# Patient Record
Sex: Female | Born: 2000 | Race: Black or African American | Hispanic: No | Marital: Single | Smoking: Current some day smoker
Health system: Southern US, Community
[De-identification: ages and names within clinical notes are randomized; demographics above are authoritative.]

## PROBLEM LIST (undated history)

## (undated) ENCOUNTER — Emergency Department (HOSPITAL_COMMUNITY): Admission: EM | Payer: Medicaid Other | Source: Home / Self Care

## (undated) DIAGNOSIS — Z8619 Personal history of other infectious and parasitic diseases: Secondary | ICD-10-CM

## (undated) DIAGNOSIS — Z87448 Personal history of other diseases of urinary system: Secondary | ICD-10-CM

## (undated) DIAGNOSIS — Z9889 Other specified postprocedural states: Secondary | ICD-10-CM

## (undated) DIAGNOSIS — R112 Nausea with vomiting, unspecified: Secondary | ICD-10-CM

## (undated) DIAGNOSIS — Z8669 Personal history of other diseases of the nervous system and sense organs: Secondary | ICD-10-CM

## (undated) HISTORY — DX: Personal history of other infectious and parasitic diseases: Z86.19

## (undated) HISTORY — DX: Personal history of other diseases of the nervous system and sense organs: Z86.69

## (undated) HISTORY — DX: Personal history of other diseases of urinary system: Z87.448

---
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
## 2020-01-28 ENCOUNTER — Other Ambulatory Visit: Payer: Self-pay

## 2020-01-28 ENCOUNTER — Encounter: Payer: Self-pay | Admitting: Family Medicine

## 2020-01-28 ENCOUNTER — Ambulatory Visit (INDEPENDENT_AMBULATORY_CARE_PROVIDER_SITE_OTHER): Payer: Medicaid Other | Admitting: Family Medicine

## 2020-01-28 VITALS — BP 90/65 | HR 68 | Ht 62.09 in | Wt 139.4 lb

## 2020-01-28 DIAGNOSIS — N62 Hypertrophy of breast: Secondary | ICD-10-CM | POA: Diagnosis not present

## 2020-01-28 NOTE — Progress Notes (Signed)
   SUBJECTIVE:   CHIEF COMPLAINT / HPI:   Breast concern - large breasts - fitted for bra 3 months ago at [facility] - size E, 34-36 EE - noting pain with and without well fitting bra - hard to sleep at night - lifts boxes at work and feels this impedes - feels like has developed hunch - some back pain, comes and goes.  - no skin changes, nipple discharge, dimpling, lumps or bumps - wants referral for breast reduction  PERTINENT  PMH / PSH: Asthma  OBJECTIVE:   BP 90/65   Pulse 68   Ht 5' 2.09" (1.577 m)   Wt 139 lb 6.4 oz (63.2 kg)   LMP 01/28/2020 (Exact Date)   SpO2 100%   BMI 25.43 kg/m   Gen: well appearing, in NAD Breast: large breasts, symmetric.   ASSESSMENT/PLAN:   Large breasts Impeding daily life and with associated chest and back pain despite well fitting bra. Referral placed for breast reduction surgery per patient request.     Ellwood Dense, DO [facility]

## 2020-01-28 NOTE — Patient Instructions (Signed)
It was great to see you!  Our plans for today:  - We referred you to plastic surgery for consultation for breast reduction. Let us know if you don't hear about an appointment in a few weeks.  - Continue to wear well supporting bras.  Try ibuprofen for pain and heating pad for back pain. - Follow-up soon for depression.  Take care and seek immediate care sooner if you develop any concerns.   Dr. [facility]

## 2020-01-28 NOTE — Progress Notes (Signed)
Patient given PHQ2 and PHQ9.  Provider aware.  .Kyro Joswick R Karcyn Menn, CMA  

## 2020-01-29 DIAGNOSIS — N62 Hypertrophy of breast: Secondary | ICD-10-CM | POA: Insufficient documentation

## 2020-01-29 NOTE — Assessment & Plan Note (Addendum)
Impeding daily life and with associated chest and back pain despite well fitting bra. Referral placed for breast reduction surgery per patient request.

## 2020-02-17 DIAGNOSIS — M549 Dorsalgia, unspecified: Secondary | ICD-10-CM | POA: Diagnosis not present

## 2020-02-17 DIAGNOSIS — N62 Hypertrophy of breast: Secondary | ICD-10-CM | POA: Diagnosis not present

## 2020-02-17 DIAGNOSIS — G8929 Other chronic pain: Secondary | ICD-10-CM | POA: Diagnosis not present

## 2020-02-17 DIAGNOSIS — M542 Cervicalgia: Secondary | ICD-10-CM | POA: Diagnosis not present

## 2020-03-01 ENCOUNTER — Ambulatory Visit (INDEPENDENT_AMBULATORY_CARE_PROVIDER_SITE_OTHER): Payer: Medicaid Other | Admitting: Family Medicine

## 2020-03-01 ENCOUNTER — Encounter: Payer: Self-pay | Admitting: Family Medicine

## 2020-03-01 ENCOUNTER — Other Ambulatory Visit: Payer: Self-pay

## 2020-03-01 VITALS — BP 100/70 | HR 76 | Ht 62.5 in | Wt 133.6 lb

## 2020-03-01 DIAGNOSIS — K5904 Chronic idiopathic constipation: Secondary | ICD-10-CM

## 2020-03-01 DIAGNOSIS — R112 Nausea with vomiting, unspecified: Secondary | ICD-10-CM | POA: Diagnosis not present

## 2020-03-01 DIAGNOSIS — F1210 Cannabis abuse, uncomplicated: Secondary | ICD-10-CM | POA: Diagnosis not present

## 2020-03-01 LAB — POCT URINE PREGNANCY: Preg Test, Ur: NEGATIVE

## 2020-03-01 MED ORDER — ONDANSETRON 4 MG PO TBDP: 4.0000 mg | ORAL_TABLET | Freq: Three times a day (TID) | ORAL | 0 refills | Status: DC | PRN

## 2020-03-01 MED ORDER — POLYETHYLENE GLYCOL 3350 17 G PO PACK: 17.0000 g | PACK | Freq: Two times a day (BID) | ORAL | 3 refills | Status: AC

## 2020-03-01 NOTE — Patient Instructions (Signed)
It was wonderful to see you today.  Please bring ALL of your medications with you to every visit.   Today we talked about:  - Getting blood work--let me know if your symptoms do not improve  - Using Miralax twice per day  - go to the pharmacy to obtain Zofran   Thank you for choosing [facility].   Please call [phone] with any questions about today's appointment.  Please be sure to schedule follow up at the front  desk before you leave today.   [name], MD  Family Medicine

## 2020-03-01 NOTE — Progress Notes (Signed)
    SUBJECTIVE:   CHIEF COMPLAINT / HPI:   Caitlyn Campos is a pleasant 19 year old girl with history significant for macromastia presenting today for nausea and vomiting for the past 5 days.  The patient reports that over the past 5 days she has woken up with a lot of nausea.  Each morning she has had 1 episode of nonbloody nonbilious emesis.  She denies hematemesis, melena, hematochezia or abdominal pain at all.  She reports she is not sexually active.  Her last period was last week.  She does not drink alcohol on a regular basis.  She smokes between 4-6 blunts per day.  She has had a history of cannabis induced vomiting reports when she stopped this previously it improved.  She is scheduled to undergo a breast reduction surgery later this month and reports she has to quit cannabis anyway.  She endorses constipation.  She does not regularly use MiraLAX.  The patient's PHQ was elevated today.  We discussed this.  She reports that overall her mood is better.  She feels like she is doing well but reports that her ADHD and depression sometimes affect her and she turns to cannabis for relief.  She declines therapy resources or medication today.  She reports she has considerable support through her family.  The patient's mom is present for portion of this visit and agrees with above.  PERTINENT  PMH / PSH/Family/Social History : Reviewed and updated.  OBJECTIVE:   BP 100/70   Pulse 76   Ht 5' 2.5" (1.588 m)   Wt 133 lb 9.6 oz (60.6 kg)   LMP 02/23/2020 (Approximate)   SpO2 98%   BMI 24.05 kg/m   HEENT: Sclera anicteric. Dentition is moderate. Appears well hydrated. Neck: Supple Cardiac: Regular rate and rhythm. Normal S1/S2. No murmurs, rubs, or gallops appreciated. Lungs: Clear bilaterally to ascultation.  Abdomen: Normoactive bowel sounds. No tenderness to deep or light palpation. No rebound or guarding. No RUQ or epigastric pain.  Extremities: Warm, well perfused without edema.  Skin:  Warm, dry Psych: Pleasant and appropriate     ASSESSMENT/PLAN:   Acute Nausea and Vomiting, considered pregnancy as cause however pregnancy test is negative and she had a period last week.  I suspect this is due to cannabis.  Also considered viral enteritis however no else in the family is sick.  She denies alcohol use.  We discussed at length the benefits of quitting cannabis and the negative effects on ADHD.  Prescription for ondansetron was given.  EKG from 2018 was reviewed.  We discussed appropriate dosing.  Also discussed side effect of constipation and prescribed MiraLAX. Reviewed reasons to call and return to care.   [name], MD  Family Medicine Teaching Service  [facility]

## 2020-03-17 DIAGNOSIS — N62 Hypertrophy of breast: Secondary | ICD-10-CM | POA: Diagnosis not present

## 2020-03-17 DIAGNOSIS — M542 Cervicalgia: Secondary | ICD-10-CM | POA: Diagnosis not present

## 2020-03-17 DIAGNOSIS — G8929 Other chronic pain: Secondary | ICD-10-CM | POA: Diagnosis not present

## 2020-03-17 DIAGNOSIS — M549 Dorsalgia, unspecified: Secondary | ICD-10-CM | POA: Diagnosis not present

## 2020-03-17 NOTE — H&P (Signed)
Subjective:     Patient ID: Caitlyn Campos is a 19 y.o. female.  HPI  Here for follow up discussion prior to planned breast reduction. Current GG. Notes thelarche age 18 years of age, menarche approximately age 7 . Notes no change bra size over last year. Reports several year history neck shoulder and back pain. This has not improved with OTC pain medication, hot/cold packs, regular activity/stretching. Notes rash beneath breasts worse in warm weather that occurs at least twice a week despite topical medication and hygiene measures. Works at [employer]. Reports difficulty lifting, carrying, bending due to breast size  Wt down 20 lb over last year, states happy at current weight. When asked about having children someday reports in the "far future."  No prior MMG. Denies family history breast or ovarian ca.   PMH significant for asthma. Lives with mother and sisters.  Review of Systems    Objective:   Physical Exam Cardiovascular:     Rate and Rhythm: Normal rate and regular rhythm.     Heart sounds: Normal heart sounds.  Pulmonary:     Effort: Pulmonary effort is normal.     Breath sounds: Normal breath sounds.  Lymphadenopathy:     Upper Body:     Right upper body: No axillary adenopathy.     Left upper body: No axillary adenopathy.  Skin:    Comments: Fitzpatrick 5    Grade 3 ptosis bilateral No masses Striae over bilateral breasts +shoulder grooving SN to nipple R 34 L 35 cm BW R 21 L 21 cm  Nipple to IMF R 16 L 16 cm     Assessment:     Macromastia Chronic neck and back pain    Plan:     Chronic neck and back pain, intertrigo in setting macromastia that has failed conservative measures. Breast reduction has expectation to relieve symptoms.   Reviewed reduction with anchor type scars, OP surgery, drains, post operative visits and limitations, recovery. Diminished sensation nipple and breast skin, risk of nipple loss, wound healing problems, asymmetry, incidental  carcinoma, changes with wt gain/loss, aging, unacceptable cosmetic appearance reviewed. Discussed changes with pregnancy, effect on breast feeding. Counseled cannot assure cup size.  Anticipate 351 g reduction from each breast. Discussed this as estimate required for insurance with mother as she has concerns needs more volume resected to be worthwhile for patient.  Additional risks including but not limited to bleeding, seroma, hematoma, damage to adjacent structures, blood clots in legs or lungs, infection, need for additional procedures reviewed.  Discussed risk COVID infectionthrough this elective surgery. Patient will receive COVID testing prior to surgery. Discussed even if patient receivesa negative test result, the tests in some cases may fail to detect the virus or patient maycontract COVID after the test.COVID 19 infectionbefore/during/aftersurgery may result in lead to a higher chance of complication and death.  Rx for tramadol given. Mother reported tramadol not effective for herself- counseled if not effective for patient will try another medication. Reviewed use of Motrin and Tylenol as well post op. Drain teaching completed.

## 2020-03-22 ENCOUNTER — Other Ambulatory Visit: Payer: Self-pay

## 2020-03-22 ENCOUNTER — Encounter (HOSPITAL_BASED_OUTPATIENT_CLINIC_OR_DEPARTMENT_OTHER): Payer: Self-pay | Admitting: Plastic Surgery

## 2020-03-26 ENCOUNTER — Other Ambulatory Visit (HOSPITAL_COMMUNITY)
Admission: RE | Admit: 2020-03-26 | Discharge: 2020-03-26 | Disposition: A | Discharge status: Home / Self Care | Payer: Medicaid Other | Source: Ambulatory Visit | Attending: Plastic Surgery | Admitting: Plastic Surgery

## 2020-03-26 DIAGNOSIS — Z01812 Encounter for preprocedural laboratory examination: Secondary | ICD-10-CM | POA: Insufficient documentation

## 2020-03-26 DIAGNOSIS — Z20822 Contact with and (suspected) exposure to covid-19: Secondary | ICD-10-CM | POA: Diagnosis not present

## 2020-03-26 LAB — SARS CORONAVIRUS 2 (TAT 6-24 HRS): SARS Coronavirus 2: NEGATIVE

## 2020-03-30 ENCOUNTER — Encounter (HOSPITAL_BASED_OUTPATIENT_CLINIC_OR_DEPARTMENT_OTHER): Payer: Self-pay | Admitting: Plastic Surgery

## 2020-03-30 ENCOUNTER — Ambulatory Visit (HOSPITAL_BASED_OUTPATIENT_CLINIC_OR_DEPARTMENT_OTHER)
Admission: RE | Admit: 2020-03-30 | Discharge: 2020-03-30 | Disposition: A | Discharge status: Home / Self Care | Payer: Medicaid Other | Attending: Plastic Surgery | Admitting: Plastic Surgery

## 2020-03-30 ENCOUNTER — Ambulatory Visit (HOSPITAL_BASED_OUTPATIENT_CLINIC_OR_DEPARTMENT_OTHER): Payer: Medicaid Other | Admitting: Certified Registered"

## 2020-03-30 ENCOUNTER — Encounter (HOSPITAL_BASED_OUTPATIENT_CLINIC_OR_DEPARTMENT_OTHER)
Admission: RE | Disposition: A | Discharge status: Home / Self Care | Payer: Self-pay | Source: Home / Self Care | Attending: Plastic Surgery

## 2020-03-30 ENCOUNTER — Other Ambulatory Visit: Payer: Self-pay

## 2020-03-30 DIAGNOSIS — M549 Dorsalgia, unspecified: Secondary | ICD-10-CM | POA: Diagnosis not present

## 2020-03-30 DIAGNOSIS — N62 Hypertrophy of breast: Secondary | ICD-10-CM | POA: Diagnosis not present

## 2020-03-30 DIAGNOSIS — M542 Cervicalgia: Secondary | ICD-10-CM | POA: Insufficient documentation

## 2020-03-30 DIAGNOSIS — G8929 Other chronic pain: Secondary | ICD-10-CM | POA: Insufficient documentation

## 2020-03-30 HISTORY — PX: BREAST REDUCTION SURGERY: SHX8

## 2020-03-30 HISTORY — DX: Nausea with vomiting, unspecified: Z98.890

## 2020-03-30 HISTORY — DX: Other specified postprocedural states: R11.2

## 2020-03-30 LAB — POCT PREGNANCY, URINE
Preg Test, Ur: NEGATIVE
Preg Test, Ur: NEGATIVE

## 2020-03-30 SURGERY — MAMMOPLASTY, REDUCTION
Anesthesia: General | Site: Breast | Laterality: Bilateral

## 2020-03-30 MED ORDER — PROPOFOL 500 MG/50ML IV EMUL
INTRAVENOUS | Status: AC
  Filled 2020-03-30: qty 50

## 2020-03-30 MED ORDER — ONDANSETRON HCL 4 MG/2ML IJ SOLN
INTRAMUSCULAR | Status: DC | PRN
  Administered 2020-03-30 (×2): 4 mg via INTRAVENOUS

## 2020-03-30 MED ORDER — PHENYLEPHRINE 40 MCG/ML (10ML) SYRINGE FOR IV PUSH (FOR BLOOD PRESSURE SUPPORT)
PREFILLED_SYRINGE | INTRAVENOUS | Status: AC
  Filled 2020-03-30: qty 10

## 2020-03-30 MED ORDER — BUPIVACAINE HCL (PF) 0.5 % IJ SOLN
INTRAMUSCULAR | Status: AC
  Filled 2020-03-30: qty 30

## 2020-03-30 MED ORDER — DEXMEDETOMIDINE (PRECEDEX) IN NS 20 MCG/5ML (4 MCG/ML) IV SYRINGE
PREFILLED_SYRINGE | INTRAVENOUS | Status: AC
  Filled 2020-03-30: qty 5

## 2020-03-30 MED ORDER — PROMETHAZINE HCL 25 MG/ML IJ SOLN: 6.2500 mg | INTRAMUSCULAR | Status: DC | PRN

## 2020-03-30 MED ORDER — LIDOCAINE 2% (20 MG/ML) 5 ML SYRINGE
INTRAMUSCULAR | Status: AC
  Filled 2020-03-30: qty 5

## 2020-03-30 MED ORDER — MIDAZOLAM HCL 5 MG/5ML IJ SOLN
INTRAMUSCULAR | Status: DC | PRN
  Administered 2020-03-30: 2 mg via INTRAVENOUS

## 2020-03-30 MED ORDER — DEXAMETHASONE SODIUM PHOSPHATE 10 MG/ML IJ SOLN
INTRAMUSCULAR | Status: AC
  Filled 2020-03-30: qty 2

## 2020-03-30 MED ORDER — OXYCODONE HCL 5 MG PO TABS: 5.0000 mg | ORAL_TABLET | Freq: Once | ORAL | Status: DC | PRN

## 2020-03-30 MED ORDER — ACETAMINOPHEN 500 MG PO TABS: 1000.0000 mg | ORAL_TABLET | ORAL | Status: DC

## 2020-03-30 MED ORDER — SUCCINYLCHOLINE CHLORIDE 200 MG/10ML IV SOSY
PREFILLED_SYRINGE | INTRAVENOUS | Status: AC
  Filled 2020-03-30: qty 10

## 2020-03-30 MED ORDER — ONDANSETRON HCL 4 MG/2ML IJ SOLN
INTRAMUSCULAR | Status: AC
  Filled 2020-03-30: qty 4

## 2020-03-30 MED ORDER — AMISULPRIDE (ANTIEMETIC) 5 MG/2ML IV SOLN
10.0000 mg | Freq: Once | INTRAVENOUS | Status: AC | PRN
  Administered 2020-03-30: 10 mg via INTRAVENOUS

## 2020-03-30 MED ORDER — SUFENTANIL CITRATE 50 MCG/ML IV SOLN
INTRAVENOUS | Status: AC
  Filled 2020-03-30: qty 1

## 2020-03-30 MED ORDER — SUCCINYLCHOLINE CHLORIDE 20 MG/ML IJ SOLN
INTRAMUSCULAR | Status: DC | PRN
  Administered 2020-03-30: 120 mg via INTRAVENOUS

## 2020-03-30 MED ORDER — OXYCODONE HCL 5 MG/5ML PO SOLN: 5.0000 mg | Freq: Once | ORAL | Status: DC | PRN

## 2020-03-30 MED ORDER — DIPHENHYDRAMINE HCL 50 MG/ML IJ SOLN
INTRAMUSCULAR | Status: DC | PRN
  Administered 2020-03-30: 6.25 mg via INTRAVENOUS

## 2020-03-30 MED ORDER — ACETAMINOPHEN 10 MG/ML IV SOLN
INTRAVENOUS | Status: AC
  Filled 2020-03-30: qty 100

## 2020-03-30 MED ORDER — EPHEDRINE 5 MG/ML INJ
INTRAVENOUS | Status: AC
  Filled 2020-03-30: qty 10

## 2020-03-30 MED ORDER — SUFENTANIL CITRATE 50 MCG/ML IV SOLN
INTRAVENOUS | Status: DC | PRN
  Administered 2020-03-30: 5 ug via INTRAVENOUS
  Administered 2020-03-30: 15 ug via INTRAVENOUS
  Administered 2020-03-30: 10 ug via INTRAVENOUS
  Administered 2020-03-30: 5 ug via INTRAVENOUS

## 2020-03-30 MED ORDER — MEPERIDINE HCL 25 MG/ML IJ SOLN: 6.2500 mg | INTRAMUSCULAR | Status: DC | PRN

## 2020-03-30 MED ORDER — ACETAMINOPHEN 500 MG PO TABS
ORAL_TABLET | ORAL | Status: AC
  Filled 2020-03-30: qty 2

## 2020-03-30 MED ORDER — AMISULPRIDE (ANTIEMETIC) 5 MG/2ML IV SOLN
INTRAVENOUS | Status: AC
  Filled 2020-03-30: qty 4

## 2020-03-30 MED ORDER — DEXAMETHASONE SODIUM PHOSPHATE 4 MG/ML IJ SOLN
INTRAMUSCULAR | Status: DC | PRN
  Administered 2020-03-30: 10 mg via INTRAVENOUS

## 2020-03-30 MED ORDER — ACETAMINOPHEN 10 MG/ML IV SOLN
INTRAVENOUS | Status: DC | PRN
  Administered 2020-03-30: 1000 mg via INTRAVENOUS

## 2020-03-30 MED ORDER — CELECOXIB 200 MG PO CAPS
ORAL_CAPSULE | ORAL | Status: AC
  Filled 2020-03-30: qty 1

## 2020-03-30 MED ORDER — BUPIVACAINE HCL (PF) 0.5 % IJ SOLN
INTRAMUSCULAR | Status: DC | PRN
  Administered 2020-03-30: 30 mL

## 2020-03-30 MED ORDER — HYDROMORPHONE HCL 1 MG/ML IJ SOLN: 0.2500 mg | INTRAMUSCULAR | Status: DC | PRN

## 2020-03-30 MED ORDER — CHLORHEXIDINE GLUCONATE CLOTH 2 % EX PADS: 6.0000 | MEDICATED_PAD | Freq: Once | CUTANEOUS | Status: DC

## 2020-03-30 MED ORDER — OXYCODONE HCL 5 MG PO TABS
ORAL_TABLET | ORAL | Status: AC
  Filled 2020-03-30: qty 1

## 2020-03-30 MED ORDER — LIDOCAINE HCL (CARDIAC) PF 100 MG/5ML IV SOSY
PREFILLED_SYRINGE | INTRAVENOUS | Status: DC | PRN
  Administered 2020-03-30: 60 mg via INTRAVENOUS

## 2020-03-30 MED ORDER — CELECOXIB 200 MG PO CAPS: 200.0000 mg | ORAL_CAPSULE | ORAL | Status: DC

## 2020-03-30 MED ORDER — SUGAMMADEX SODIUM 200 MG/2ML IV SOLN
INTRAVENOUS | Status: DC | PRN
  Administered 2020-03-30: 150 mg via INTRAVENOUS

## 2020-03-30 MED ORDER — DEXAMETHASONE SODIUM PHOSPHATE 10 MG/ML IJ SOLN
INTRAMUSCULAR | Status: AC
  Filled 2020-03-30: qty 1

## 2020-03-30 MED ORDER — 0.9 % SODIUM CHLORIDE (POUR BTL) OPTIME
TOPICAL | Status: DC | PRN
  Administered 2020-03-30: 500 mL

## 2020-03-30 MED ORDER — GABAPENTIN 300 MG PO CAPS
ORAL_CAPSULE | ORAL | Status: AC
  Filled 2020-03-30: qty 1

## 2020-03-30 MED ORDER — CEFAZOLIN SODIUM-DEXTROSE 2-4 GM/100ML-% IV SOLN
INTRAVENOUS | Status: AC
  Filled 2020-03-30: qty 100

## 2020-03-30 MED ORDER — LACTATED RINGERS IV SOLN: INTRAVENOUS | Status: DC

## 2020-03-30 MED ORDER — PROPOFOL 500 MG/50ML IV EMUL
INTRAVENOUS | Status: AC
  Filled 2020-03-30: qty 100

## 2020-03-30 MED ORDER — GABAPENTIN 300 MG PO CAPS: 300.0000 mg | ORAL_CAPSULE | ORAL | Status: DC

## 2020-03-30 MED ORDER — CEFAZOLIN SODIUM-DEXTROSE 2-4 GM/100ML-% IV SOLN
2.0000 g | INTRAVENOUS | Status: AC
  Administered 2020-03-30: 2 g via INTRAVENOUS

## 2020-03-30 MED ORDER — PROPOFOL 500 MG/50ML IV EMUL
INTRAVENOUS | Status: DC | PRN
  Administered 2020-03-30: 25 ug/kg/min via INTRAVENOUS

## 2020-03-30 MED ORDER — ROCURONIUM BROMIDE 100 MG/10ML IV SOLN
INTRAVENOUS | Status: DC | PRN
  Administered 2020-03-30: 60 mg via INTRAVENOUS

## 2020-03-30 MED ORDER — PROPOFOL 10 MG/ML IV BOLUS
INTRAVENOUS | Status: DC | PRN
  Administered 2020-03-30: 150 mg via INTRAVENOUS

## 2020-03-30 MED ORDER — ONDANSETRON HCL 4 MG/2ML IJ SOLN
INTRAMUSCULAR | Status: AC
  Filled 2020-03-30: qty 2

## 2020-03-30 MED ORDER — ROCURONIUM BROMIDE 10 MG/ML (PF) SYRINGE
PREFILLED_SYRINGE | INTRAVENOUS | Status: AC
  Filled 2020-03-30: qty 10

## 2020-03-30 MED ORDER — MIDAZOLAM HCL 2 MG/2ML IJ SOLN
INTRAMUSCULAR | Status: AC
  Filled 2020-03-30: qty 2

## 2020-03-30 MED ORDER — DIPHENHYDRAMINE HCL 50 MG/ML IJ SOLN
INTRAMUSCULAR | Status: AC
  Filled 2020-03-30: qty 1

## 2020-03-30 SURGICAL SUPPLY — 57 items
APPLIER CLIP 9.375 MED OPEN (MISCELLANEOUS)
BINDER BREAST 3XL (GAUZE/BANDAGES/DRESSINGS) IMPLANT
BINDER BREAST LRG (GAUZE/BANDAGES/DRESSINGS) ×2 IMPLANT
BINDER BREAST MEDIUM (GAUZE/BANDAGES/DRESSINGS) IMPLANT
BINDER BREAST XLRG (GAUZE/BANDAGES/DRESSINGS) IMPLANT
BINDER BREAST XXLRG (GAUZE/BANDAGES/DRESSINGS) IMPLANT
BLADE SURG 10 STRL SS (BLADE) ×8 IMPLANT
BNDG GAUZE ELAST 4 BULKY (GAUZE/BANDAGES/DRESSINGS) ×4 IMPLANT
CANISTER SUCT 1200ML W/VALVE (MISCELLANEOUS) ×2 IMPLANT
CHLORAPREP W/TINT 26 (MISCELLANEOUS) ×6 IMPLANT
CLIP APPLIE 9.375 MED OPEN (MISCELLANEOUS) IMPLANT
CLIP VESOCCLUDE MED 6/CT (CLIP) IMPLANT
COVER BACK TABLE 60X90IN (DRAPES) ×2 IMPLANT
COVER MAYO STAND STRL (DRAPES) ×2 IMPLANT
COVER WAND RF STERILE (DRAPES) IMPLANT
DERMABOND ADVANCED (GAUZE/BANDAGES/DRESSINGS) ×2
DERMABOND ADVANCED .7 DNX12 (GAUZE/BANDAGES/DRESSINGS) ×2 IMPLANT
DRAIN CHANNEL 15F RND FF W/TCR (WOUND CARE) ×4 IMPLANT
DRAPE TOP ARMCOVERS (MISCELLANEOUS) ×2 IMPLANT
DRAPE U-SHAPE 76X120 STRL (DRAPES) ×2 IMPLANT
DRAPE UTILITY XL STRL (DRAPES) ×2 IMPLANT
DRSG PAD ABDOMINAL 8X10 ST (GAUZE/BANDAGES/DRESSINGS) ×4 IMPLANT
ELECT COATED BLADE 2.86 ST (ELECTRODE) ×2 IMPLANT
ELECT REM PT RETURN 9FT ADLT (ELECTROSURGICAL) ×2
ELECTRODE REM PT RTRN 9FT ADLT (ELECTROSURGICAL) ×1 IMPLANT
EVACUATOR SILICONE 100CC (DRAIN) ×4 IMPLANT
GLOVE BIO SURGEON STRL SZ 6 (GLOVE) ×6 IMPLANT
GLOVE BIO SURGEON STRL SZ 6.5 (GLOVE) ×6 IMPLANT
GLOVE BIOGEL PI IND STRL 7.0 (GLOVE) ×4 IMPLANT
GLOVE BIOGEL PI INDICATOR 7.0 (GLOVE) ×4
GLOVE ECLIPSE 6.5 STRL STRAW (GLOVE) ×2 IMPLANT
GOWN STRL REUS W/ TWL LRG LVL3 (GOWN DISPOSABLE) ×4 IMPLANT
GOWN STRL REUS W/TWL LRG LVL3 (GOWN DISPOSABLE) ×4
MARKER SKIN DUAL TIP RULER LAB (MISCELLANEOUS) IMPLANT
NEEDLE HYPO 25X1 1.5 SAFETY (NEEDLE) ×2 IMPLANT
NS IRRIG 1000ML POUR BTL (IV SOLUTION) ×2 IMPLANT
PACK BASIN DAY SURGERY FS (CUSTOM PROCEDURE TRAY) ×2 IMPLANT
PENCIL SMOKE EVACUATOR (MISCELLANEOUS) ×2 IMPLANT
PIN SAFETY STERILE (MISCELLANEOUS) ×2 IMPLANT
SHEET MEDIUM DRAPE 40X70 STRL (DRAPES) ×4 IMPLANT
SLEEVE SCD COMPRESS KNEE MED (MISCELLANEOUS) ×2 IMPLANT
SPONGE LAP 18X18 RF (DISPOSABLE) ×6 IMPLANT
STAPLER VISISTAT 35W (STAPLE) ×4 IMPLANT
SUT ETHILON 2 0 FS 18 (SUTURE) ×2 IMPLANT
SUT MNCRL AB 4-0 PS2 18 (SUTURE) ×8 IMPLANT
SUT PDS AB 2-0 CT2 27 (SUTURE) IMPLANT
SUT VIC AB 3-0 PS1 18 (SUTURE) ×6
SUT VIC AB 3-0 PS1 18XBRD (SUTURE) ×6 IMPLANT
SUT VICRYL 4-0 PS2 18IN ABS (SUTURE) ×4 IMPLANT
SYR BULB IRRIG 60ML STRL (SYRINGE) ×2 IMPLANT
SYR CONTROL 10ML LL (SYRINGE) ×2 IMPLANT
TAPE MEASURE VINYL STERILE (MISCELLANEOUS) IMPLANT
TOWEL GREEN STERILE FF (TOWEL DISPOSABLE) ×4 IMPLANT
TRAY FOLEY W/BAG SLVR 14FR LF (SET/KITS/TRAYS/PACK) IMPLANT
TUBE CONNECTING 20X1/4 (TUBING) ×2 IMPLANT
UNDERPAD 30X36 HEAVY ABSORB (UNDERPADS AND DIAPERS) ×4 IMPLANT
YANKAUER SUCT BULB TIP NO VENT (SUCTIONS) ×2 IMPLANT

## 2020-03-30 NOTE — Transfer of Care (Signed)
Immediate Anesthesia Transfer of Care Note  Patient: Caitlyn Campos  Procedure(s) Performed: BILATERAL BREAST REDUCTION (Bilateral Breast)  Patient Location: PACU  Anesthesia Type:General  Level of Consciousness: awake, alert , oriented and drowsy  Airway & Oxygen Therapy: Patient Spontanous Breathing and Patient connected to face mask oxygen  Post-op Assessment: Report given to RN and Post -op Vital signs reviewed and stable  Post vital signs: Reviewed and stable  Last Vitals:  Vitals Value Taken Time  BP 147/79 03/30/20 1148  Temp    Pulse 109 03/30/20 1156  Resp 13 03/30/20 1156  SpO2 99 % 03/30/20 1156  Vitals shown include unvalidated device data.  Last Pain:  Vitals:   03/30/20 0659  TempSrc: Oral  PainSc: 0-No pain      Patients Stated Pain Goal: 3 (03/30/20 0659)  Complications: No complications documented.

## 2020-03-30 NOTE — Op Note (Signed)
Operative Note   DATE OF OPERATION: 8.17.21  LOCATION: [facility]-outpatient  SURGICAL DIVISION: Plastic Surgery  PREOPERATIVE DIAGNOSES:  1. Macromastia 2. Chronic neck and back pain  POSTOPERATIVE DIAGNOSES:  same  PROCEDURE:  Bilateral breast reduction  SURGEON: Glenna Fellows MD MBA  ASSISTANT: none  ANESTHESIA:  General.   EBL: 45 ml  COMPLICATIONS: None immediate.   INDICATIONS FOR PROCEDURE:  The patient, Caitlyn Campos, is a 19 y.o. female born on May 20, 2001, is here for treatment chronic neck and back pain in setting macromastia that has failed conservative measures.    FINDINGS: Right reduction 573 g Left reduction 608 g  DESCRIPTION OF PROCEDURE:  The patient was marked standing in the preoperative area to mark sternal notch, chest midline, anterior axillary lines, inframammary folds. The location of new nipple areolar complex was marked at level of on inframammary fold on anterior surface breast by palpation. This was marked symmetric over bilateral breasts. With aid of Wise pattern marker, location of new nipple areolar complex and vertical limbs (6 cm) were markedby displacement of breasts along meridian. The patient was taken to the operating room. SCDs were placedand IV antibiotics were given. The patient's operative site was prepped and draped in a sterile fashion. A time out was performed and all information was confirmed to be correct.   Over left breast, superomedial pedicle marked and nipple areolar complexincisedwith 43mm diameter marker. Pedicle deepithlialized and developedto chest wall. Breast tissue resected over lower pole. Medial and lateral flaps developed.Additionalsuperior pole andlateral chest wall tissue excised.Breast tailor tacked closed.  I then directed attention to right breast where superomedial pedicle designed.NAC marked with 79mm diameter marker.The pedicle was deepithelialized. Pedicle developed until tension free  rotation was possible.Breast tissue resected over lower pole. Medial and lateral flaps developed.Additionalsuperior pole andlateral chest wall tissue excised. Breast tailor tacked closed, and patient brought to upright sitting position and assessed for symmetry. Patent returned to supine position. Breast cavities irrigated and hemostasis obtained.Local anestheticinfiltrated throughout each breast. 15 Fr JP placed in each breast and secured with 2-0 nylon. Closure completed bilateralwith 3-0 vicryl to approximate dermis along inframammary fold and vertical limb. NAC inset with 4-0 vicryl in dermis. Skin closure completed with 4-0 monocryl subcuticular throughout. Tissue adhesive applied. Dry dressing and breast binder applied.  The patient was allowed to wake from anesthesia, extubated and taken to the recovery room in satisfactory condition.   SPECIMENS: right and left breast reduction  DRAINS: 15 Fr JP in right and left breast

## 2020-03-30 NOTE — Interval H&P Note (Signed)
History and Physical Interval Note:  03/30/2020 7:02 AM  Caitlyn Campos  has presented today for surgery, with the diagnosis of macromastia, chronic neck and back pain, intertrigo.  The various methods of treatment have been discussed with the patient and family. After consideration of risks, benefits and other options for treatment, the patient has consented to  Procedure(s): BILATERAL BREAST REDUCTION (Bilateral) as a surgical intervention.  The patient's history has been reviewed, patient examined, no change in status, stable for surgery.  I have reviewed the patient's chart and labs.  Questions were answered to the patient's satisfaction.     [name]

## 2020-03-30 NOTE — Anesthesia Preprocedure Evaluation (Signed)
Anesthesia Evaluation  Patient identified by MRN, date of birth, ID band Patient awake    Reviewed: Allergy & Precautions, NPO status , Patient's Chart, lab work & pertinent test results  Airway Mallampati: II  TM Distance: >3 FB Neck ROM: Full    Dental no notable dental hx.    Pulmonary asthma ,    Pulmonary exam normal breath sounds clear to auscultation       Cardiovascular negative cardio ROS Normal cardiovascular exam Rhythm:Regular Rate:Normal     Neuro/Psych negative neurological ROS  negative psych ROS   GI/Hepatic negative GI ROS, Neg liver ROS,   Endo/Other  negative endocrine ROS  Renal/GU negative Renal ROS  negative genitourinary   Musculoskeletal negative musculoskeletal ROS (+)   Abdominal   Peds negative pediatric ROS (+)  Hematology negative hematology ROS (+)   Anesthesia Other Findings   Reproductive/Obstetrics negative OB ROS                             Anesthesia Physical Anesthesia Plan  ASA: II  Anesthesia Plan: General   Post-op Pain Management:    Induction: Intravenous  PONV Risk Score and Plan: 3 and Ondansetron, Dexamethasone, Midazolam and Treatment may vary due to age or medical condition  Airway Management Planned: Oral ETT  Additional Equipment:   Intra-op Plan:   Post-operative Plan: Extubation in OR  Informed Consent: I have reviewed the patients History and Physical, chart, labs and discussed the procedure including the risks, benefits and alternatives for the proposed anesthesia with the patient or authorized representative who has indicated his/her understanding and acceptance.     Dental advisory given  Plan Discussed with: CRNA  Anesthesia Plan Comments:         Anesthesia Quick Evaluation  

## 2020-03-30 NOTE — Anesthesia Postprocedure Evaluation (Addendum)
Anesthesia Post Note  Patient: Caitlyn Campos  Procedure(s) Performed: BILATERAL BREAST REDUCTION (Bilateral Breast)     Patient location during evaluation: Phase II Anesthesia Type: General Level of consciousness: awake and sedated Pain management: pain level controlled Vital Signs Assessment: post-procedure vital signs reviewed and stable Respiratory status: spontaneous breathing Cardiovascular status: stable Postop Assessment: no apparent nausea or vomiting Anesthetic complications: yes Comments: PONV   No complications documented.  Last Vitals:  Vitals:   03/30/20 1212 03/30/20 1346  BP: (!) 143/79 (!) 140/91  Pulse: 97 (!) 113  Resp: 19 16  Temp: 36.6 C 37.1 C  SpO2: 100% 99%    Last Pain:  Vitals:   03/30/20 1346  TempSrc:   PainSc: 5                  John F Annica Marinello Jr

## 2020-03-30 NOTE — Discharge Instructions (Signed)

## 2020-03-30 NOTE — Anesthesia Procedure Notes (Signed)
Procedure Name: Intubation Date/Time: 03/30/2020 7:34 AM Performed by: Willa Frater, CRNA Pre-anesthesia Checklist: Patient identified, Emergency Drugs available, Suction available and Patient being monitored Patient Re-evaluated:Patient Re-evaluated prior to induction Oxygen Delivery Method: Circle system utilized Preoxygenation: Pre-oxygenation with 100% oxygen Induction Type: IV induction and Cricoid Pressure applied Ventilation: Mask ventilation without difficulty Laryngoscope Size: Mac and 3 Grade View: Grade I Tube type: Oral Number of attempts: 1 Airway Equipment and Method: Stylet and Oral airway Placement Confirmation: ETT inserted through vocal cords under direct vision,  positive ETCO2 and breath sounds checked- equal and bilateral Secured at: 22 cm Tube secured with: Tape Dental Injury: Teeth and Oropharynx as per pre-operative assessment

## 2020-03-31 ENCOUNTER — Encounter (HOSPITAL_BASED_OUTPATIENT_CLINIC_OR_DEPARTMENT_OTHER): Payer: Self-pay | Admitting: Plastic Surgery

## 2020-03-31 ENCOUNTER — Other Ambulatory Visit: Payer: Self-pay

## 2020-03-31 LAB — SURGICAL PATHOLOGY

## 2020-08-14 DIAGNOSIS — U071 COVID-19: Secondary | ICD-10-CM

## 2020-08-14 HISTORY — DX: COVID-19: U07.1

## 2020-08-16 DIAGNOSIS — H5213 Myopia, bilateral: Secondary | ICD-10-CM | POA: Diagnosis not present

## 2020-09-03 ENCOUNTER — Ambulatory Visit (INDEPENDENT_AMBULATORY_CARE_PROVIDER_SITE_OTHER): Payer: Medicaid Other | Admitting: Family Medicine

## 2020-09-03 ENCOUNTER — Other Ambulatory Visit: Payer: Self-pay

## 2020-09-03 VITALS — BP 102/60 | HR 74 | Ht 62.0 in | Wt 116.5 lb

## 2020-09-03 DIAGNOSIS — Z23 Encounter for immunization: Secondary | ICD-10-CM | POA: Diagnosis not present

## 2020-09-03 DIAGNOSIS — H109 Unspecified conjunctivitis: Secondary | ICD-10-CM

## 2020-09-03 DIAGNOSIS — H5789 Other specified disorders of eye and adnexa: Secondary | ICD-10-CM | POA: Diagnosis not present

## 2020-09-03 MED ORDER — POLYMYXIN B-TRIMETHOPRIM 10000-0.1 UNIT/ML-% OP SOLN: 2.0000 [drp] | OPHTHALMIC | 0 refills | Status: AC

## 2020-09-03 NOTE — Progress Notes (Signed)
   SUBJECTIVE:   CHIEF COMPLAINT / HPI:   Chief Complaint  Patient presents with  . Stye    Left eye     Caitlyn Campos is a 20 y.o. female here for left eye swelling and pain.    Left eye swelling  She has been putting warm compresses twice a day without improvement. Endorses pain and swelling. Sx started about 3 days ago. Had similar sx a year ago but this one hurts more. Discharge is coming out and is crusted over in the morning.    PERTINENT  PMH / PSH: reviewed and updated as appropriate   OBJECTIVE:   BP 102/60   Pulse 74   Ht 5\' 2"  (1.575 m)   Wt 116 lb 8 oz (52.8 kg)   LMP 08/27/2020   SpO2 98%   BMI 21.31 kg/m    GEN: well appearing female in no acute distress  EYES: PERRLA, EOM intact and without pain, left eyelid edematous and erythematous, gross vision is intact as she is interacting with her cell phone, mucoid discharge present along the lower eyelid, there is a localized area c/f hordeolum  HENT: no nasal discharge, no maxillary or frontal sinus tenderness  CVS: well perfused  RESP: speaking in full sentences without pause, no respiratory distress  SKIN:  Warm, dry    ASSESSMENT/PLAN:   Eye swelling, left Bacterial conjunctivitis vs internal hordeolum. Not likely preseptal cellulitis or orbital cellulitis. Treat with Poly-Trim. Discussed red flag sx discussed with pt. ED precautions given. Follow up if not improving on Monday.    Influenza vaccine given today.   Saturday, DO PGY-2, [facility] 09/03/2020

## 2020-09-03 NOTE — Assessment & Plan Note (Signed)
Bacterial conjunctivitis vs internal hordeolum. Not likely preseptal cellulitis or orbital cellulitis. Treat with Poly-Trim. Discussed red flag sx discussed with pt. ED precautions given. Follow up if not improving on Monday.

## 2020-09-03 NOTE — Patient Instructions (Addendum)
It was great seeing you today!   Stop by the pharmacy to pick up your medications. If your eye pain and swelling worsen or accompany headaches and/or vision changes, please  do not hesitate to call at [phone].  Dr. Katherina [facility]

## 2020-09-15 ENCOUNTER — Encounter (HOSPITAL_COMMUNITY): Payer: Self-pay | Admitting: Emergency Medicine

## 2020-09-15 DIAGNOSIS — R0602 Shortness of breath: Secondary | ICD-10-CM | POA: Diagnosis not present

## 2020-09-15 DIAGNOSIS — R509 Fever, unspecified: Secondary | ICD-10-CM | POA: Diagnosis not present

## 2020-09-15 DIAGNOSIS — R059 Cough, unspecified: Secondary | ICD-10-CM | POA: Diagnosis not present

## 2020-09-29 DIAGNOSIS — H5213 Myopia, bilateral: Secondary | ICD-10-CM | POA: Diagnosis not present

## 2020-10-01 ENCOUNTER — Other Ambulatory Visit: Payer: Self-pay

## 2020-10-01 ENCOUNTER — Ambulatory Visit (INDEPENDENT_AMBULATORY_CARE_PROVIDER_SITE_OTHER): Payer: Medicaid Other | Admitting: Family Medicine

## 2020-10-01 VITALS — BP 102/60 | HR 75 | Ht 62.0 in | Wt 118.4 lb

## 2020-10-01 DIAGNOSIS — R350 Frequency of micturition: Secondary | ICD-10-CM

## 2020-10-01 DIAGNOSIS — R634 Abnormal weight loss: Secondary | ICD-10-CM | POA: Diagnosis not present

## 2020-10-01 NOTE — Patient Instructions (Signed)
It was great seeing you today!  Please check-out at the front desk before leaving the clinic. I'd like to see you back in 2 weeks but if you need to be seen earlier than that for any new issues we're happy to fit you in, just give [place] a call!  Visit Remembers: - Keep a record on at least 3 days in the next 2 weeks of everything you have eaten and drank. Bring this to your appointment.    Regarding lab work today:  Due to recent changes in healthcare laws, you may see the results of your imaging and laboratory studies on MyChart before your provider has had a chance to review them.  I understand that in some cases there may be results that are confusing or concerning to you. Not all laboratory results come back in the same time frame and you may be waiting for multiple results in order to interpret others.  Please give [place] 72 hours in order for your provider to thoroughly review all the results before contacting the office for clarification of your results. If everything is normal, you will get a letter in the mail or a message in My Chart. Please give [place] a call if you do not hear from [place] after 2 weeks.  Please bring all of your medications with you to each visit.    If you haven't already, sign up for My Chart to have easy access to your labs results, and communication with your primary care physician.  Feel free to call with any questions or concerns at any time, at [phone].   Take care,  Dr. Katherina [facility]

## 2020-10-02 LAB — COMPREHENSIVE METABOLIC PANEL
ALT: 8 IU/L (ref 0–32)
AST: 21 IU/L (ref 0–40)
Albumin/Globulin Ratio: 1.7 (ref 1.2–2.2)
Albumin: 4.7 g/dL (ref 3.9–5.0)
Alkaline Phosphatase: 97 IU/L (ref 42–106)
BUN/Creatinine Ratio: 12 (ref 9–23)
BUN: 8 mg/dL (ref 6–20)
Bilirubin Total: 0.5 mg/dL (ref 0.0–1.2)
CO2: 22 mmol/L (ref 20–29)
Calcium: 10.1 mg/dL (ref 8.7–10.2)
Chloride: 101 mmol/L (ref 96–106)
Creatinine, Ser: 0.69 mg/dL (ref 0.57–1.00)
GFR calc Af Amer: 146 mL/min/{1.73_m2} (ref >59)
GFR calc non Af Amer: 127 mL/min/{1.73_m2} (ref >59)
Globulin, Total: 2.8 g/dL (ref 1.5–4.5)
Glucose: 78 mg/dL (ref 65–99)
Potassium: 4.1 mmol/L (ref 3.5–5.2)
Sodium: 139 mmol/L (ref 134–144)
Total Protein: 7.5 g/dL (ref 6.0–8.5)

## 2020-10-02 LAB — CBC WITH DIFFERENTIAL/PLATELET
Basophils Absolute: 0 10*3/uL (ref 0.0–0.2)
Basos: 0 %
EOS (ABSOLUTE): 0.1 10*3/uL (ref 0.0–0.4)
Eos: 1 %
Hematocrit: 39.6 % (ref 34.0–46.6)
Hemoglobin: 12.4 g/dL (ref 11.1–15.9)
Immature Grans (Abs): 0 10*3/uL (ref 0.0–0.1)
Immature Granulocytes: 0 %
Lymphocytes Absolute: 1.9 10*3/uL (ref 0.7–3.1)
Lymphs: 41 %
MCH: 22.6 pg — ABNORMAL LOW (ref 26.6–33.0)
MCHC: 31.3 g/dL — ABNORMAL LOW (ref 31.5–35.7)
MCV: 72 fL — ABNORMAL LOW (ref 79–97)
Monocytes Absolute: 0.4 10*3/uL (ref 0.1–0.9)
Monocytes: 9 %
Neutrophils Absolute: 2.3 10*3/uL (ref 1.4–7.0)
Neutrophils: 49 %
Platelets: 336 10*3/uL (ref 150–450)
RBC: 5.48 x10E6/uL — ABNORMAL HIGH (ref 3.77–5.28)
RDW: 14.8 % (ref 11.7–15.4)
WBC: 4.6 10*3/uL (ref 3.4–10.8)

## 2020-10-02 LAB — HEMOGLOBIN A1C
Est. average glucose Bld gHb Est-mCnc: 103 mg/dL
Hgb A1c MFr Bld: 5.2 % (ref 4.8–5.6)

## 2020-10-02 LAB — TSH+FREE T4
Free T4: 1.14 ng/dL (ref 0.93–1.60)
TSH: 2.13 u[IU]/mL (ref 0.450–4.500)

## 2020-10-04 ENCOUNTER — Encounter: Payer: Self-pay | Admitting: Family Medicine

## 2020-10-04 DIAGNOSIS — R634 Abnormal weight loss: Secondary | ICD-10-CM | POA: Insufficient documentation

## 2020-10-04 NOTE — Assessment & Plan Note (Addendum)
Of note, patient has lost about 40 pounds in 2 years.  January 2020 158lbs, July 2021 133 lbs and today 118 lbs.   Brief 24-hour recall: Of at 8 AM, rate 32 into the water, lunch 11 AM: Hot pocket and coffee with 3 Ritz crackers and cheese with, afternoon lunch (pizza with pepperoni sausage cheese and about a liter of water  A 20 year old female with unintentional weight loss etiology and prognosis unsure.  Patient in a high risk social situation with mom.  No history of disordered eating, eating disorder or self-harm.  PHQ 9 elevated at 11 today.  Obtain CBC, TSH, T4, CMP, UA.  Differential diagnosis includes but not limited to cancer, hyperthyroidism, infection, type 1 diabetes, disordered eating versus eating disorder.  Patient may benefit from referral for dietitian.  Patient to follow-up in 2 weeks with food log.  Discuss next steps at that time.

## 2020-10-04 NOTE — Progress Notes (Signed)
   SUBJECTIVE:   CHIEF COMPLAINT / HPI:   Chief Complaint  Patient presents with  . Weight Loss     Caitlyn Campos is a 20 y.o. female here for unintended weight loss.   Patient states that she eats 1-2 times daily.  Food and fluids usually something like a hot pocket or flat bread pizza.  Things that are quick and easy.  She likes to eat wings however recently she ate 3 wings and felt like she needed to vomit.  Her sister states that the last time she ate a real good meal was at Thanksgiving.  Last menstrual period 09/27/2020 and was normal.  She snacks on crackers and drinks lots of water.  Denies vomiting, history of eating disorder, disordered eating or self-harm.  She used to get abdominal cramping that lasted about 30 minutes but this eventually went away.  Endorses frequent urination and family hx of diabetes.    PERTINENT  PMH / PSH: reviewed and updated as appropriate   OBJECTIVE:   BP 102/60   Pulse 75   Ht 5\' 2"  (1.575 m)   Wt 118 lb 6 oz (53.7 kg)   LMP 09/24/2020   SpO2 98%   BMI 21.65 kg/m    GEN: pleasant thin female, in no acute distress  CV: regular rate and rhythm, no murmurs appreciated  RESP: no increased work of breathing, clear to ascultation bilaterally ABD: Bowel sounds present. Soft, Nontender, Nondistended. SKIN: warm, dry PSYCH: Normal affect, appropriate speech and behavior    ASSESSMENT/PLAN:   Unintentional weight loss Of note, patient has lost about 40 pounds in 2 years.  January 2020 158lbs, July 2021 133 lbs and today 118 lbs.   Brief 24-hour recall: Of at 8 AM, rate 32 into the water, lunch 11 AM: Hot pocket and coffee with 3 Ritz crackers and cheese with, afternoon lunch (pizza with pepperoni sausage cheese and about a liter of water  A 20 year old female with unintentional weight loss etiology and prognosis unsure.  Patient in a high risk social situation with mom.  No history of disordered eating, eating disorder or self-harm.  PHQ 9  elevated at 11 today.  Obtain CBC, TSH, T4, CMP, UA.  Differential diagnosis includes but not limited to cancer, hyperthyroidism, infection, type 1 diabetes, disordered eating versus eating disorder.  Patient may benefit from referral for dietitian.  Patient to follow-up in 2 weeks with food log.  Discuss next steps at that time.       12, DO PGY-2, [facility] 10/04/2020

## 2020-10-18 ENCOUNTER — Encounter: Payer: Self-pay | Admitting: Family Medicine

## 2020-10-18 ENCOUNTER — Ambulatory Visit (INDEPENDENT_AMBULATORY_CARE_PROVIDER_SITE_OTHER): Payer: Medicaid Other | Admitting: Family Medicine

## 2020-10-18 ENCOUNTER — Other Ambulatory Visit: Payer: Self-pay

## 2020-10-18 VITALS — BP 94/60 | HR 74 | Ht 62.0 in | Wt 125.2 lb

## 2020-10-18 DIAGNOSIS — F4522 Body dysmorphic disorder: Secondary | ICD-10-CM

## 2020-10-18 DIAGNOSIS — R634 Abnormal weight loss: Secondary | ICD-10-CM | POA: Diagnosis not present

## 2020-10-18 NOTE — Patient Instructions (Addendum)
It was great seeing you today!    TO DO:  - Keep a log of 3 days of what eat and drink  - Please contact Dr Gerilyn Pilgrim for a nutrition appointment to discuss your nutrition needs. Call: [phone].   For therapy and resources to discuss body dysphoria and disordered eating:  [facility] [address], [place], [place] [address] [phone]  Follow up with me in 1 month .    Take Care,   Dr. Rachael Darby

## 2020-10-18 NOTE — Progress Notes (Addendum)
   SUBJECTIVE:   CHIEF COMPLAINT / HPI:   Chief Complaint  Patient presents with  . Follow-up     Caitlyn Campos is a 20 y.o. female here for follow up for unintentional weight loss. Hx of body dysphoria. She has been trying to eat at least one meal per day.  Pt works at [employer]. Friday she was able to eat 4 chicken tenders. Last meal was Ramen with salmon and egg.   Brief 24 hr Recall  Woke up at 730a  8a at work - Drank medium Arnold-Palmer- sweetened with sugar at work; sipped throughout the day 9 a chewed stick of gum (spit out at 11a)   4p - 2 fried chicken tenders  5:30 p - Off work  8 pm - sip of Fanta Strawberry , Troli candy 8 gummies  2a - drank water 6 oz    PERTINENT  PMH / PSH: reviewed and updated as appropriate   OBJECTIVE:   BP 94/60   Pulse 74   Ht 5\' 2"  (1.575 m)   Wt 125 lb 4 oz (56.8 kg)   LMP 09/24/2020   SpO2 98%   BMI 22.91 kg/m    Previous weight on 10/01/20: 118 lb 6 oz  Today: 125 lb 4 oz  Net: + ~7 lbs   GEN: well appearing female in no acute distress  CVS: well perfused  RESP: speaking in full sentences without pause, no respiratory distress      ASSESSMENT/PLAN:   Unintentional weight loss Previous work up unremarkable. She has gained 7lbs since our last visit. Meals are not well defined and somewhat non-existent.   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 She had a breast reduction in August (size G to size C). Pt has changed her eating habits as she does not want to increase her breast size. She did not like the way she looked in the mirror when she had larger breasts. She denies starvation, intentional vomiting and laxative use. Discussed disordered eating. Recommended therapy and help of a registered dietician. Pt agreeable. Given resources for [facility] and Dr. [facility], RD, contact information. PHQ 9: score 12 with 0 for question 9.  Follow up in 1 month. Discuss antidepressant medication at follow  up visit.      Gerilyn Pilgrim, DO PGY-2, [facility] 10/18/2020

## 2020-10-21 NOTE — Assessment & Plan Note (Addendum)
Previous work up unremarkable. She has gained 7lbs since our last visit. Meals are not well defined and somewhat non-existent.   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 She had a breast reduction in August (size G to size C). Pt has changed her eating habits as she does not want to increase her breast size. She did not like the way she looked in the mirror when she had larger breasts. She denies starvation, intentional vomiting and laxative use. Discussed disordered eating. Recommended therapy and help of a registered dietician. Pt agreeable. Given resources for [facility] and Dr. Gerilyn Pilgrim, RD, contact information. PHQ 9: score 12 with 0 for question 9.  Follow up in 1 month. Discuss antidepressant medication at follow up visit.

## 2020-10-21 NOTE — Addendum Note (Signed)
Addended by: Katha Cabal D on: 10/21/2020 09:34 AM   Modules accepted: Orders

## 2020-11-22 ENCOUNTER — Encounter: Payer: Self-pay | Admitting: Emergency Medicine

## 2020-11-22 ENCOUNTER — Telehealth: Payer: Medicaid Other | Admitting: Emergency Medicine

## 2020-11-22 DIAGNOSIS — N939 Abnormal uterine and vaginal bleeding, unspecified: Secondary | ICD-10-CM | POA: Diagnosis not present

## 2020-11-22 NOTE — Progress Notes (Signed)
Ms. belle, charlie are scheduled for a virtual visit with your provider today.    Just as we do with appointments in the office, we must obtain your consent to participate.  Your consent will be active for this visit and any virtual visit you may have with one of our providers in the next 365 days.    If you have a MyChart account, I can also send a copy of this consent to you electronically.  All virtual visits are billed to your insurance company just like a traditional visit in the office.  As this is a virtual visit, video technology does not allow for your provider to perform a traditional examination.  This may limit your provider's ability to fully assess your condition.  If your provider identifies any concerns that need to be evaluated in person or the need to arrange testing such as labs, EKG, etc, we will make arrangements to do so.    Although advances in technology are sophisticated, we cannot ensure that it will always work on either your end or our end.  If the connection with a video visit is poor, we may have to switch to a telephone visit.  With either a video or telephone visit, we are not always able to ensure that we have a secure connection.   I need to obtain your verbal consent now.   Are you willing to proceed with your visit today?   Lashae Wollenberg has provided verbal consent on 11/22/2020 for a virtual visit (video or telephone).   Lurene Shadow, PA-C 11/22/2020  11:20 AM   Date:  11/22/2020   ID:  Alan Mulder, DOB 06/25/2001, MRN [id number]  Patient Location: Home Provider Location: Home Office   Participants: Patient and Provider for Visit and Wrap up  Method of visit: Video  Location of Patient: Home Location of Provider: Home Office Consent was obtain for visit over the video. Services rendered by provider: Visit was performed via video  A video enabled telemedicine application was used and I verified that I am speaking with the correct person using two  identifiers.  PCP:  Katha Cabal, DO   Chief Complaint:  Abnormal vaginal bleeding  History of Present Illness:    Ameya Vowell is a 20 y.o. female with history as stated below. Presents video telehealth for an acute care visit  Onset of symptoms was 3-4 days ago and symptoms have been persistent and include: vaginal bleeding described as spotting, it starts in the morning but is very light. This is atypical for pt.  Associated mild nausea. Pt's last menses was 10/18/20.  She has had unprotected intercourse recently and is not on birth control. She has not taken a pregnancy test yet. Denies recent change in medications or supplements.   Denies having fevers, chills, abdominal pain, vomiting, diarrhea or urinary symptoms.   Modifying factors include: none tried.   No other aggravating or relieving factors.  No other c/o.  Past Medical, Surgical, Social History, Allergies, and Medications have been Reviewed.  Patient Active Problem List   Diagnosis Date Noted  . Unintentional weight loss 10/04/2020  . Cannabis use disorder, mild 03/01/2020  . Large breasts 01/29/2020  . Asthma 04/28/2010    Social History   Tobacco Use  . Smoking status: Passive Smoke Exposure - Never Smoker  . Smokeless tobacco: Never Used  Substance Use Topics  . Alcohol use: Not Currently     Current Outpatient Medications:  .  albuterol (VENTOLIN HFA) 108 (90  Base) MCG/ACT inhaler, Inhale 2 puffs into the lungs every 4 (four) hours as needed., Disp: 2 Inhaler, Rfl: 1 .  cetirizine HCl (ZYRTEC) 5 MG/5ML SYRP, Take 5 mLs (5 mg total) by mouth daily., Disp: 120 mL, Rfl: 0 .  polyethylene glycol (MIRALAX / GLYCOLAX) 17 g packet, Take 17 g by mouth 2 (two) times daily., Disp: 100 each, Rfl: 3   No Known Allergies   ROS See HPI for history of present illness.  Physical Exam Constitutional:      General: She is not in acute distress.    Appearance: Normal appearance. She is not ill-appearing,  toxic-appearing or diaphoretic.  Pulmonary:     Effort: Pulmonary effort is normal. No respiratory distress.  Neurological:     Mental Status: She is alert.  Psychiatric:        Mood and Affect: Mood normal.        Behavior: Behavior normal.               A&P  1. Vaginal spotting.  -She denies abdominal pain, vomiting, or urinary symptoms   -Pt reports some concern for pregnancy, encouraged to take an at-home pregnancy test, or may have one performed in office with PCP or GYN  -Discussed various causes of irregular menses including pregnancy, change in medication or supplements, change in diet, or stress levels.  -Encouraged further evaluation in-person with PCP or GYN   Patient voiced understanding and agreement to plan.   Time:   Today, I have spent 10 minutes with the patient with telehealth technology discussing the above problems, reviewing the chart, previous notes, medications and orders.    Tests Ordered: No orders of the defined types were placed in this encounter.   Medication Changes: No orders of the defined types were placed in this encounter.    Disposition:  Follow up PCP or GYN for further evaluation of irregular menses.   Cherly Hensen, PA-C  11/22/2020 11:20 AM

## 2020-11-25 ENCOUNTER — Ambulatory Visit (INDEPENDENT_AMBULATORY_CARE_PROVIDER_SITE_OTHER): Payer: Medicaid Other | Admitting: Student in an Organized Health Care Education/Training Program

## 2020-11-25 ENCOUNTER — Other Ambulatory Visit: Payer: Self-pay

## 2020-11-25 VITALS — BP 102/58 | HR 78 | Ht 62.0 in | Wt 121.6 lb

## 2020-11-25 DIAGNOSIS — D508 Other iron deficiency anemias: Secondary | ICD-10-CM

## 2020-11-25 DIAGNOSIS — Z7251 High risk heterosexual behavior: Secondary | ICD-10-CM

## 2020-11-25 DIAGNOSIS — N926 Irregular menstruation, unspecified: Secondary | ICD-10-CM | POA: Diagnosis not present

## 2020-11-25 LAB — POCT URINE PREGNANCY: Preg Test, Ur: NEGATIVE

## 2020-11-25 NOTE — Patient Instructions (Signed)
It was a pleasure to see you today!  To summarize our discussion for this visit:  I'm sorry you're having more bleeding than usual. I think we are still in a time period that I think it is safe to watch a few more days to see if the bleeding stops on its own. We will check for anemia today and I will contact you with the results to see how your bleeding is doing. We can discuss next steps at that time.   Please avoid taking NSAIDs  Some additional health maintenance measures we should update are: Health Maintenance Due  Topic Date Due  . Hepatitis C Screening  Never done  . HIV Screening  Never done  . HPV VACCINES (3 - 3-dose series) 10/10/2019  .   Please return to our clinic to see me as needed.  Call the clinic at [phone] if your symptoms worsen or you have any concerns.   Thank you for allowing me to take part in your care,  Dr. Jamelle Rushing   Dysfunctional Uterine Bleeding Dysfunctional uterine bleeding is abnormal bleeding from the uterus. Dysfunctional uterine bleeding includes:  A menstrual period that comes earlier or later than usual.  A menstrual period that is lighter or heavier than usual, or has large blood clots.  Vaginal bleeding between menstrual periods.  Skipping one or more menstrual periods.  Vaginal bleeding after sex.  Vaginal bleeding after menopause. Follow these instructions at home: Eating and drinking  Eat well-balanced meals. Include foods that are high in iron, such as liver, meat, shellfish, green leafy vegetables, and eggs.  To prevent or treat constipation, your health care provider may recommend that you: ? Drink enough fluid to keep your urine pale yellow. ? Take over-the-counter or prescription medicines. ? Eat foods that are high in fiber, such as beans, whole grains, and fresh fruits and vegetables. ? Limit foods that are high in fat and processed sugars, such as fried or sweet foods.   Medicines  Take over-the-counter  and prescription medicines only as told by your health care provider.  Do not change medicines without talking with your health care provider.  Aspirin or medicines that contain aspirin may make the bleeding worse. Do not take those medicines: ? During the week before your menstrual period. ? During your menstrual period.  If you were prescribed iron pills, take them as told by your health care provider. Iron pills help to replace iron that your body loses because of this condition. Activity  If you need to change your sanitary pad or tampon more than one time every 2 hours: ? Lie in bed with your feet raised (elevated). ? Place a cold pack on your lower abdomen. ? Rest as much as possible until the bleeding stops or slows down.  Do not try to lose weight until the bleeding has stopped and your blood iron level is back to normal. General instructions  For two months, write down: ? When your menstrual period starts. ? When your menstrual period ends. ? When any abnormal vaginal bleeding occurs. ? What problems you notice.  Keep all follow up visits as told by your health care provider. This is important.   Contact a health care provider if you:  Feel light-headed or weak.  Have nausea and vomiting.  Cannot eat or drink without vomiting.  Feel dizzy or have diarrhea while you are taking medicines.  Are taking birth control pills or hormones, and you want to change them or  stop taking them. Get help right away if:  You develop a fever or chills.  You need to change your sanitary pad or tampon more than one time per hour.  Your vaginal bleeding becomes heavier, or your flow contains clots more often.  You develop pain in your abdomen.  You lose consciousness.  You develop a rash. Summary  Dysfunctional uterine bleeding is abnormal bleeding from the uterus.  It includes menstrual bleeding of abnormal duration, volume, or regularity.  Bleeding after sex and after  menopause are also considered dysfunctional uterine bleeding. This information is not intended to replace advice given to you by your health care provider. Make sure you discuss any questions you have with your health care provider. Document Revised: 01/09/2018 Document Reviewed: 01/09/2018 Elsevier Patient Education  2021 [employer].

## 2020-11-25 NOTE — Progress Notes (Signed)
   SUBJECTIVE:   CHIEF COMPLAINT / HPI: irregular periods  Usually has pretty regular periods.  Last menses 3/7. Had unprotected sex 4/7. Not on birth control. Now had bleeding started 4/8. Has been heavy throughout. Has mild pelvic cramping with periods and consistent this time.  Some nausea. Has anemia symptoms- lightheaded when she stands up normally but not worsened this week. Also feels racing heart. Currently still bleeding. Not outside of normal period bleeding.  No medications OTC. Not on birth control.  Never been pregnant before. Never had any STIs.  Recently started working out. Twice per week. burpees and russian twists for about 30 minutes.   OBJECTIVE:   BP (!) 102/58   Pulse 78   Ht 5\' 2"  (1.575 m)   Wt 121 lb 9.6 oz (55.2 kg)   LMP 11/18/2020 Comment: started bleeding not sure if period  SpO2 98%   BMI 22.24 kg/m   Physical Exam Vitals and nursing note reviewed.  Constitutional:      General: She is not in acute distress.    Appearance: She is normal weight.  Cardiovascular:     Pulses: Normal pulses.  Pulmonary:     Effort: Pulmonary effort is normal.  Abdominal:     General: Abdomen is flat.     Palpations: Abdomen is soft.     Tenderness: There is no abdominal tenderness.  Genitourinary:    Uterus: Normal. Not tender.      Adnexa: Right adnexa normal and left adnexa normal.       Right: No tenderness.         Left: No tenderness.    Neurological:     Mental Status: She is alert.    ASSESSMENT/PLAN:   Irregular periods Patient has regular periods.  This menstrual cycle has been longer than usual by about 2-3 days and described as her heaviest days. She otherwise feels fine but is concerned that something is wrong.  - CBC to assess for anemia due to chronic symptoms of anemia in setting of increased bleeding - neg upreg today - provided patient with reassurance of normal variant in young women and will monitor closely if not resolving in the  next few days.      01/18/2021, DO [facility]

## 2020-11-26 LAB — CBC
Hematocrit: 37.7 % (ref 34.0–46.6)
Hemoglobin: 11.7 g/dL (ref 11.1–15.9)
MCH: 23 pg — ABNORMAL LOW (ref 26.6–33.0)
MCHC: 31 g/dL — ABNORMAL LOW (ref 31.5–35.7)
MCV: 74 fL — ABNORMAL LOW (ref 79–97)
Platelets: 358 10*3/uL (ref 150–450)
RBC: 5.08 x10E6/uL (ref 3.77–5.28)
RDW: 13.9 % (ref 11.7–15.4)
WBC: 5.5 10*3/uL (ref 3.4–10.8)

## 2020-11-29 ENCOUNTER — Encounter: Payer: Self-pay | Admitting: Family Medicine

## 2020-11-29 DIAGNOSIS — N926 Irregular menstruation, unspecified: Secondary | ICD-10-CM | POA: Insufficient documentation

## 2020-11-29 NOTE — Assessment & Plan Note (Addendum)
Patient has regular periods.  This menstrual cycle has been longer than usual by about 2-3 days and described as her heaviest days. She otherwise feels fine but is concerned that something is wrong.  - CBC to assess for anemia due to chronic symptoms of anemia in setting of increased bleeding - neg upreg today - provided patient with reassurance of normal variant in young women and will monitor closely if not resolving in the next few days.

## 2020-12-21 ENCOUNTER — Telehealth: Payer: Self-pay

## 2020-12-21 NOTE — Telephone Encounter (Signed)
Mother sent mychart message on behalf of patient requesting referral to Allergist.   Please advise.   Aminta Sakurai C Claus Silvestro, RN 

## 2020-12-29 ENCOUNTER — Other Ambulatory Visit: Payer: Self-pay

## 2020-12-29 ENCOUNTER — Ambulatory Visit (INDEPENDENT_AMBULATORY_CARE_PROVIDER_SITE_OTHER): Payer: Medicaid Other | Admitting: Family Medicine

## 2020-12-29 VITALS — BP 104/62 | HR 71 | Ht 62.0 in | Wt 120.6 lb

## 2020-12-29 DIAGNOSIS — N3001 Acute cystitis with hematuria: Secondary | ICD-10-CM | POA: Diagnosis not present

## 2020-12-29 DIAGNOSIS — R82998 Other abnormal findings in urine: Secondary | ICD-10-CM | POA: Diagnosis not present

## 2020-12-29 DIAGNOSIS — R634 Abnormal weight loss: Secondary | ICD-10-CM

## 2020-12-29 MED ORDER — CEPHALEXIN 500 MG PO CAPS: 500.0000 mg | ORAL_CAPSULE | Freq: Four times a day (QID) | ORAL | 0 refills | Status: AC

## 2020-12-29 NOTE — Progress Notes (Signed)
    SUBJECTIVE:   CHIEF COMPLAINT / HPI:   Dysuria  Started 3 days ago Increased frequency of urination Dysuria Increased urgency Has had some hematuria before starting Azo No fevers, nausea, vomiting, flank pain No change in vaginal discharge Not sexually active, Patient's last menstrual period was 12/14/2020.  No chance of pregnancy Has been taking Azo, seems to help a little bit Has only noticed darkening in urine after starting azo   Weight Loss Has a history of depression, wasn't treated or had therapy Feels like she is doing okay She states that she is stressed a lot  Denies SI Has had labs with Dr. Rachael Darby in February and all WNL  Flowsheet Row Office Visit from 12/29/2020 in [facility]  PHQ-9 Total Score 12      PERTINENT  PMH / PSH: Asthma  OBJECTIVE:   BP 104/62   Pulse 71   Ht 5\' 2"  (1.575 m)   Wt 120 lb 9.6 oz (54.7 kg)   LMP 12/14/2020   SpO2 96%   BMI 22.06 kg/m    Physical Exam:  General: 20 y.o. female in NAD Lungs: Breathing comfortably on room air Abdomen: Soft, mild suprapubic tenderness, non-distended,  no CVA tenderness Skin: warm and dry Extremities: No edema Psych: Mood and affect appropriate for circumstance, appropriate dress, thought process linear and logical, no SI   ASSESSMENT/PLAN:   Acute cystitis with hematuria Symptoms are very consistent with a UTI given her history.  She is very low risk for resistance.  Cannot perform a point-of-care UA today given that she has been on Azo.  Did order a send out urinalysis with microscopy and culture to confirm.  Will prescribe Keflex 500 mg every 6 hours for 5 days.  Patient given return precautions including no improvement in symptoms, fever, inability to tolerate p.o., flank pain.  She voiced understanding.  Unintentional weight loss Over the last 6 months, patient's weight has fluctuated by few pounds, but continues to go up and down.  She does report a  significant amount of stress in her life.  Her PHQ-9 is 12.  Dr. 12 has done a previously extensive work-up of labs that has been normal.  Today, recommended therapy and given resources.  She is not suicidal, but given suicide hotline number.  She will follow-up with Dr. Rachael Darby in 2 to 4 weeks.     Rachael Darby, DO [facility]

## 2020-12-29 NOTE — Assessment & Plan Note (Signed)
Over the last 6 months, patient's weight has fluctuated by few pounds, but continues to go up and down.  She does report a significant amount of stress in her life.  Her PHQ-9 is 12.  Dr. Rachael Darby has done a previously extensive work-up of labs that has been normal.  Today, recommended therapy and given resources.  She is not suicidal, but given suicide hotline number.  She will follow-up with Dr. Rachael Darby in 2 to 4 weeks.

## 2020-12-29 NOTE — Patient Instructions (Signed)
Thank you for coming to see me today. It was a pleasure. Today we talked about:   Please look into the therapists listed below that take your insurance.  We will treat you for a UTI with Keflex.  Take this four times a day for 5 days.  Take ALL of the antibiotic.  You can continue azo as needed for pain.  Stay hydrated.  I will contact you with the culture results via mychart.  Come back if no improvement after you complete treatment, or if you develop fevers, vomiting, back pain.    Please follow-up with PCP in 2-4 weeks.  If you have any questions or concerns, please do not hesitate to call the office at [phone].  Best,   Luis Abed, DO    Therapy and Counseling Resources Most providers on this list will take Medicaid. Patients with commercial insurance or Medicare should contact their insurance company to get a list of in network providers.  BestDay:Psychiatry and Counseling [address] [place], [place] [address] [phone]  [facility]  [address] Suite [place], [place] [address]      [phone]  [facility] [address]  [place], [place] [address] [phone]  [facility] [address], [address]  [place], [place] [address]       [phone]     [facility] (virtual only) [phone]  Jovita Kussmaul [facility] [address] [place], [place] [phone]  [facility]:  [address] [place] [phone]  [facility]:  [address] [place] [phone]  [facility] (under & uninsured) [address]   [place] [phone]    [email or web address]    [facility] [address] . [place]    [phone]  [facility] -[address]     Phone:  [phone]     [facility]-  [address]  [phone]   [facility] [address]      [place], [place] [phone]  [facility]: [address] [place], [place], [place]  [phone]   [facility] (Spanish therapist) [email or web address]  [address] [place]  [address]   [place] [address]    [phone]    [facility]   [address],  [place], [place]  [phone]   [facility]  [address] [place]  [phone]  [facility]  [address] [place], [place]        [phone]  Open Access/Walk In Clinic under & uninsured  [facility]  [address] [place], [place] [phone] Crisis [phone]  [facility],  (Spanish)   [address], [place]: [phone]) 8:30 - 12; 1 - 2:30  [facility] [employer],  [address] [place], [place]    [phone]):8:30 - 12; 2 - 3PM  [facility] [employer],  [address],  [place]; [phone]):   Mon - Fri 8 AM - 5 PM  [facility] [address] [place]  MWF 12:30 to 3:00 or call to schedule an appointment  [phone]  Specific Provider options Psychology Today  [email or web address] 1. click on find a therapist  2. enter your zip code 3. left side and  select or tailor a therapist for your specific need.   [facility] Provider Directory [email or web address]  (Medicaid)   Follow all drop down to find a provider  Social Support program [facility] [place] [phone] or PhotoSolver.pl [address], [place], [place] Recovery support and educational   24- Hour Availability:  .  Marland [facility]  . [address] [place], [place] [employer] [phone] Crisis [phone]  . [facility] [employer] [phone]  [facility] Crisis Service  [phone]   . [facility] [employer]  [phone] (after hours)  . Therapeutic Alternative/Mobile Crisis   [phone]  . [place] National Suicide Hotline  410-863-8256 (TALK)  . Call 911 or go to emergency room  . [employer]  [phone]);  [place] and [employer]   . [facility]  [phone]); [place], [place], [place], [place], [place], [place], [place]

## 2020-12-29 NOTE — Assessment & Plan Note (Signed)
Symptoms are very consistent with a UTI given her history.  She is very low risk for resistance.  Cannot perform a point-of-care UA today given that she has been on Azo.  Did order a send out urinalysis with microscopy and culture to confirm.  Will prescribe Keflex 500 mg every 6 hours for 5 days.  Patient given return precautions including no improvement in symptoms, fever, inability to tolerate p.o., flank pain.  She voiced understanding.

## 2020-12-30 LAB — MICROSCOPIC EXAMINATION
Casts: NONE SEEN /lpf
WBC, UA: >30 /hpf — AB (ref 0–5)

## 2020-12-30 LAB — URINALYSIS, ROUTINE W REFLEX MICROSCOPIC
Bilirubin, UA: NEGATIVE
Glucose, UA: NEGATIVE
Nitrite, UA: POSITIVE — AB
Specific Gravity, UA: 1.017 (ref 1.005–1.030)
Urobilinogen, Ur: 1 mg/dL (ref 0.2–1.0)
pH, UA: 6.5 (ref 5.0–7.5)

## 2021-01-03 ENCOUNTER — Encounter: Payer: Self-pay | Admitting: Family Medicine

## 2021-01-03 LAB — URINE CULTURE

## 2021-01-24 ENCOUNTER — Ambulatory Visit: Payer: Medicaid Other | Admitting: Student in an Organized Health Care Education/Training Program

## 2021-01-25 ENCOUNTER — Ambulatory Visit: Payer: Medicaid Other

## 2021-03-09 DIAGNOSIS — N898 Other specified noninflammatory disorders of vagina: Secondary | ICD-10-CM | POA: Diagnosis not present

## 2021-03-09 DIAGNOSIS — Z87440 Personal history of urinary (tract) infections: Secondary | ICD-10-CM | POA: Diagnosis not present

## 2021-03-09 DIAGNOSIS — Z01411 Encounter for gynecological examination (general) (routine) with abnormal findings: Secondary | ICD-10-CM | POA: Diagnosis not present

## 2021-03-09 DIAGNOSIS — Z0000 Encounter for general adult medical examination without abnormal findings: Secondary | ICD-10-CM | POA: Diagnosis not present

## 2021-03-09 DIAGNOSIS — N760 Acute vaginitis: Secondary | ICD-10-CM | POA: Diagnosis not present

## 2021-03-09 DIAGNOSIS — Z113 Encounter for screening for infections with a predominantly sexual mode of transmission: Secondary | ICD-10-CM | POA: Diagnosis not present

## 2021-03-09 DIAGNOSIS — N390 Urinary tract infection, site not specified: Secondary | ICD-10-CM | POA: Diagnosis not present

## 2021-03-09 DIAGNOSIS — Z0001 Encounter for general adult medical examination with abnormal findings: Secondary | ICD-10-CM | POA: Diagnosis not present

## 2021-03-21 ENCOUNTER — Other Ambulatory Visit: Payer: Self-pay

## 2021-03-21 ENCOUNTER — Encounter: Payer: Self-pay | Admitting: Family Medicine

## 2021-03-21 ENCOUNTER — Ambulatory Visit (INDEPENDENT_AMBULATORY_CARE_PROVIDER_SITE_OTHER): Payer: Medicaid Other | Admitting: Family Medicine

## 2021-03-21 ENCOUNTER — Other Ambulatory Visit (HOSPITAL_COMMUNITY)
Admission: RE | Admit: 2021-03-21 | Discharge: 2021-03-21 | Disposition: A | Discharge status: Home / Self Care | Payer: Medicaid Other | Source: Ambulatory Visit | Attending: Family Medicine | Admitting: Family Medicine

## 2021-03-21 VITALS — BP 100/75 | HR 76 | Ht 62.5 in | Wt 118.8 lb

## 2021-03-21 DIAGNOSIS — Z113 Encounter for screening for infections with a predominantly sexual mode of transmission: Secondary | ICD-10-CM | POA: Diagnosis not present

## 2021-03-21 DIAGNOSIS — B373 Candidiasis of vulva and vagina: Secondary | ICD-10-CM

## 2021-03-21 DIAGNOSIS — R300 Dysuria: Secondary | ICD-10-CM

## 2021-03-21 DIAGNOSIS — N898 Other specified noninflammatory disorders of vagina: Secondary | ICD-10-CM | POA: Insufficient documentation

## 2021-03-21 DIAGNOSIS — B3731 Acute candidiasis of vulva and vagina: Secondary | ICD-10-CM

## 2021-03-21 LAB — POCT WET PREP (WET MOUNT)
Clue Cells Wet Prep Whiff POC: NEGATIVE
Trichomonas Wet Prep HPF POC: ABSENT

## 2021-03-21 LAB — POCT UA - MICROSCOPIC ONLY: Epithelial cells, urine per micros: >20

## 2021-03-21 LAB — POCT URINALYSIS DIP (CLINITEK)
Bilirubin, UA: NEGATIVE
Blood, UA: NEGATIVE
Glucose, UA: NEGATIVE mg/dL
Ketones, POC UA: NEGATIVE mg/dL
Nitrite, UA: NEGATIVE
POC PROTEIN,UA: NEGATIVE
Spec Grav, UA: >=1.03 — AB (ref 1.010–1.025)
Urobilinogen, UA: 1 E.U./dL
pH, UA: 6.5 (ref 5.0–8.0)

## 2021-03-21 LAB — POCT URINE PREGNANCY: Preg Test, Ur: NEGATIVE

## 2021-03-21 MED ORDER — FLUCONAZOLE 150 MG PO TABS: 150.0000 mg | ORAL_TABLET | Freq: Once | ORAL | 0 refills | Status: AC

## 2021-03-21 NOTE — Patient Instructions (Signed)
It was wonderful to see you today.  Please bring ALL of your medications with you to every visit.   Today we talked about:  --Going to the lab for blood work for testing for HIV and Hepatitis C-I will call you with results  --Checking for a yeast infection     Thank you for choosing [facility].   Please call [phone] with any questions about today's appointment.  Please be sure to schedule follow up at the front  desk before you leave today.   [name], MD  Family Medicine

## 2021-03-21 NOTE — Progress Notes (Signed)
    SUBJECTIVE:   CHIEF COMPLAINT: Vaginal discharge HPI:   Caitlyn Campos is a 20 y.o. yo with history notable for asthma presenting for vaginal discharge.  The patient reports she was recently treated for bacterial vaginosis.  She reports she then developed thick white vaginal discharge.  She reports she is not sexually active.  Last menstrual period was 2 weeks ago.  She denies dysuria, hematuria, back pain or fevers.  She tried nothing for her symptoms.  The patient currently works at [employer].  She reports her appetite is okay.  She does endorse some constipation.  No blood in her bowel movements.  PERTINENT  PMH / PSH/Family/Social History : Updated and reviewed notable for history of difficulty gaining weight and constipation  OBJECTIVE:   BP 100/75   Pulse 76   Ht 5' 2.5" (1.588 m)   Wt 118 lb 12.8 oz (53.9 kg)   LMP 03/15/2021 (Approximate)   SpO2 98%   BMI 21.38 kg/m   Today's weight:  Last Weight  Most recent update: 03/21/2021  9:45 AM    Weight  53.9 kg (118 lb 12.8 oz)            Review of prior weights: Filed Weights   03/21/21 0940  Weight: 118 lb 12.8 oz (53.9 kg)    Cardiac: Regular rate and rhythm. Normal S1/S2. No murmurs, rubs, or gallops appreciated. Lungs: Clear bilaterally to ascultation.  Abdomen: Normoactive bowel sounds. No tenderness to deep or light palpation. No rebound or guarding.   Skin: multiple tatoos   GU Exam:  Chaperoned exam.  External exam: Normal-appearing female external genitalia.  Vaginal exam notable for thick white discharge.  Cervix without discharge or obvious lesion.    ASSESSMENT/PLAN:  Candida vaginitis wet prep positive treated with fluconazole.  Call patient with results.  STI testing offered and recommended STI testing.  Appropriate labs ordered.  Will call patient with results or message via MyChart.     [name], MD  Family Medicine Teaching Service  [facility]

## 2021-03-22 LAB — HCV INTERPRETATION

## 2021-03-22 LAB — HIV ANTIBODY (ROUTINE TESTING W REFLEX): HIV Screen 4th Generation wRfx: NONREACTIVE

## 2021-03-22 LAB — CERVICOVAGINAL ANCILLARY ONLY
Chlamydia: NEGATIVE
Comment: NEGATIVE
Comment: NEGATIVE
Comment: NORMAL
Neisseria Gonorrhea: NEGATIVE
Trichomonas: NEGATIVE

## 2021-03-22 LAB — RPR: RPR Ser Ql: NONREACTIVE

## 2021-03-22 LAB — HCV AB W REFLEX TO QUANT PCR: HCV Ab: <0.1 s/co ratio (ref 0.0–0.9)

## 2021-03-23 ENCOUNTER — Encounter: Payer: Self-pay | Admitting: Family Medicine

## 2021-03-23 ENCOUNTER — Telehealth: Payer: Self-pay | Admitting: Family Medicine

## 2021-03-23 NOTE — Telephone Encounter (Signed)
Attempted to call patient with results. Called patient's first number (home). Caitlyn Campos not present, mother answered phone.   Home number is mother's number. Unable to give information to mother as patient is adult. Discussed HIPPA rules with mother.    Then tried patient's cell phone.   All results are negative (Normal no infection). Unable to reach patient on cell phone.   Please let patient know if she calls back. Will also message via mychart.

## 2021-06-01 ENCOUNTER — Encounter: Payer: Self-pay | Admitting: Student

## 2021-06-01 ENCOUNTER — Other Ambulatory Visit: Payer: Self-pay

## 2021-06-01 ENCOUNTER — Ambulatory Visit (INDEPENDENT_AMBULATORY_CARE_PROVIDER_SITE_OTHER): Payer: Medicaid Other | Admitting: Student

## 2021-06-01 VITALS — BP 100/60 | HR 71 | Ht 62.5 in | Wt 128.0 lb

## 2021-06-01 DIAGNOSIS — N898 Other specified noninflammatory disorders of vagina: Secondary | ICD-10-CM

## 2021-06-01 DIAGNOSIS — N760 Acute vaginitis: Secondary | ICD-10-CM | POA: Diagnosis not present

## 2021-06-01 DIAGNOSIS — Z8659 Personal history of other mental and behavioral disorders: Secondary | ICD-10-CM | POA: Diagnosis not present

## 2021-06-01 DIAGNOSIS — Z23 Encounter for immunization: Secondary | ICD-10-CM | POA: Diagnosis not present

## 2021-06-01 DIAGNOSIS — Z7185 Encounter for immunization safety counseling: Secondary | ICD-10-CM | POA: Diagnosis not present

## 2021-06-01 DIAGNOSIS — B9689 Other specified bacterial agents as the cause of diseases classified elsewhere: Secondary | ICD-10-CM | POA: Diagnosis not present

## 2021-06-01 LAB — POCT WET PREP (WET MOUNT)
Clue Cells Wet Prep Whiff POC: POSITIVE
Trichomonas Wet Prep HPF POC: ABSENT

## 2021-06-01 LAB — POCT URINE PREGNANCY: Preg Test, Ur: NEGATIVE

## 2021-06-01 MED ORDER — SERTRALINE HCL 50 MG PO TABS: 50.0000 mg | ORAL_TABLET | Freq: Every day | ORAL | 3 refills | Status: DC

## 2021-06-01 MED ORDER — METRONIDAZOLE 500 MG PO TABS: 500.0000 mg | ORAL_TABLET | Freq: Two times a day (BID) | ORAL | 0 refills | Status: AC

## 2021-06-01 NOTE — Assessment & Plan Note (Signed)
Patient had positive clue cells and positive whiff test on wet prep that is indicative of bacterial vaginosis.  She has had bacterial vaginosis in the past and has recently been sexually active.  She declined STI testing today as she previously had a full panel done on 03/21/2021 that was negative.  We also continued with a pregnancy test as the patient is not on birth control and does not use protection.  Pregnancy test was negative.  We will treat with Flagyl 500 mg twice daily for 7 .

## 2021-06-01 NOTE — Assessment & Plan Note (Signed)
Patient scored a 14 on the PHQ-9.  She goes to therapy twice weekly and previously had not wanted to start medication.  Today she was interested in starting medication, so we started the lowest dose of sertraline at 50 mg a day.  We will start her the first week on 25 mg and then taper up to 50 the next week.  Is to continue with her therapy as scheduled.  We will follow-up for her depression in 1 month and evaluate a PHQ-9 and GAD-7 at that time.

## 2021-06-01 NOTE — Progress Notes (Addendum)
SUBJECTIVE:   CHIEF COMPLAINT / HPI:   Vaginal Odor/Discharge:  Pt was here previously on 03/21/2021 and was found to have a yeast infection. STI testing at that time was negative. She went to a gynecologist after that visit and got a prescription for metronidazole for a diagnosis of BV. She missed one day of the metronidazole, but she finished the prescription in totality. Initially, that helped with her symptoms.   A month ago, she was using the bathroom, felt some discomfort. Possibly had white discharge, but no smell. About one week ago, she noticed an odor that is similar to the first time she had BV in August. She also had white discharge with the odor.   She has some vaginal itching, that may be related to shaving, but she does not have burning with urination,   Last time she had sexual intercourse was one month ago, no protection, and not on BC. She has had one sexual partner in the past six months.   Does not want STI testing at this time.   Depressive/Anxiety symptoms: These symptoms have been present for four years. Has improved over time. She goes to therapy 1-2 times a week, on [facility]. No meds, she has been asked if she wanted medication in the past but has previously declined it. No SI/HI. Can overeat or have a poor appetite, and can feel down, depressed, sad and hopeless. She does note some symptoms of anxiety, but more depressive symptoms. PHQ-9 was 14.  Pt is interested in medication options today. She has no past history of mania or bipolar disorder.   Hobbies: plays video games and drawing, sometimes helps with emotional state when anxious.  PERTINENT  PMH / PSH: Asthma, h/o yeast infection   OBJECTIVE:   BP 100/60   Pulse 71   Ht 5' 2.5" (1.588 m)   Wt 128 lb (58.1 kg)   LMP 04/19/2021   SpO2 99%   BMI 23.04 kg/m   General: Alert and oriented in no apparent distress, pleasant 20 yo F Heart: Regular rate and rhythm with no murmurs appreciated Lungs: CTA  bilaterally, no wheezing Abdomen: Bowel sounds present, no abdominal pain Skin: Warm and dry Extremities: No lower extremity edema GU: Normal female genitalia with some white discharge in vaginal vault  Labs:   Ref. Range 06/01/2021 14:32 06/01/2021 14:55  Yeast Wet Prep HPF POC Latest Ref Range: None  None   Clue Cells Wet Prep HPF POC Latest Ref Range: None  Many (A)   WBC, Wet Prep HPF POC Unknown 1-5   Bacteria Wet Prep HPF POC Latest Ref Range: Few  Many (A)   Trichomonas Wet Prep HPF POC Latest Ref Range: Absent  Absent   Preg Test, Ur Latest Ref Range: Negative   Negative  Source Wet Prep POC Unknown VAG   Clue Cells Wet Prep Whiff POC Unknown Positive Whiff   KOH Wet Prep POC Latest Ref Range: None  None     ASSESSMENT/PLAN:   Bacterial vaginosis Patient had positive clue cells and positive whiff test on wet prep that is indicative of bacterial vaginosis.  She has had bacterial vaginosis in the past and has recently been sexually active.  She declined STI testing today as she previously had a full panel done on 03/21/2021 that was negative.  We also continued with a pregnancy test as the patient is not on birth control and does not use protection.  Pregnancy test was negative.  We  will treat with Flagyl 500 mg twice daily for 7 .   History of depressive symptoms Patient scored a 14 on the PHQ-9.  She goes to therapy twice weekly and previously had not wanted to start medication.  Today she was interested in starting medication, so we started the lowest dose of sertraline at 50 mg a day.  We will start her the first week on 25 mg and then taper up to 50 the next week.  Is to continue with her therapy as scheduled.  We will follow-up for her depression in 1 month and evaluate a PHQ-9 and GAD-7 at that time.    Alfredo Martinez, MD [facility]

## 2021-06-01 NOTE — Patient Instructions (Signed)
It was nice to see you today!  We are can continue with the sertraline as discussed, and you can start with 25 mg for the first week and work up to 50 mg after that.  I would like you for you to be seen here again in a month.  Continue with your therapy as well.  I will call you with the results of the labs we got today and will send in prescriptions if necessary.  Best, Alfredo Martinez

## 2021-08-16 ENCOUNTER — Telehealth: Payer: Self-pay

## 2021-08-16 NOTE — Telephone Encounter (Signed)
Opened in error.   Liron Eissler C Amber Williard, RN  

## 2021-11-30 ENCOUNTER — Other Ambulatory Visit: Payer: Self-pay

## 2021-11-30 ENCOUNTER — Emergency Department (HOSPITAL_BASED_OUTPATIENT_CLINIC_OR_DEPARTMENT_OTHER)
Admission: EM | Admit: 2021-11-30 | Discharge: 2021-12-01 | Disposition: A | Discharge status: Home / Self Care | Payer: Medicaid Other | Attending: Emergency Medicine | Admitting: Emergency Medicine

## 2021-11-30 ENCOUNTER — Encounter (HOSPITAL_BASED_OUTPATIENT_CLINIC_OR_DEPARTMENT_OTHER): Payer: Self-pay

## 2021-11-30 DIAGNOSIS — J069 Acute upper respiratory infection, unspecified: Secondary | ICD-10-CM | POA: Insufficient documentation

## 2021-11-30 DIAGNOSIS — J45909 Unspecified asthma, uncomplicated: Secondary | ICD-10-CM | POA: Diagnosis not present

## 2021-11-30 DIAGNOSIS — Z20822 Contact with and (suspected) exposure to covid-19: Secondary | ICD-10-CM | POA: Insufficient documentation

## 2021-11-30 DIAGNOSIS — H6121 Impacted cerumen, right ear: Secondary | ICD-10-CM | POA: Insufficient documentation

## 2021-11-30 DIAGNOSIS — R059 Cough, unspecified: Secondary | ICD-10-CM | POA: Diagnosis present

## 2021-11-30 DIAGNOSIS — B9789 Other viral agents as the cause of diseases classified elsewhere: Secondary | ICD-10-CM | POA: Diagnosis not present

## 2021-11-30 LAB — RESP PANEL BY RT-PCR (FLU A&B, COVID) ARPGX2
Influenza A by PCR: NEGATIVE
Influenza B by PCR: NEGATIVE
SARS Coronavirus 2 by RT PCR: NEGATIVE

## 2021-11-30 LAB — GROUP A STREP BY PCR: Group A Strep by PCR: NOT DETECTED

## 2021-11-30 NOTE — ED Notes (Signed)
Fluids offered.  

## 2021-11-30 NOTE — ED Provider Notes (Signed)
?[facility] EMERGENCY DEPT ?Provider Note ? ? ?CSN: [id number] ?Arrival date & time: 11/30/21  2155 ? ?  ? ?History ? ?Chief Complaint  ?Patient presents with  ? Cough  ? ? ?Caitlyn Campos is a 21 y.o. female. ? ?Patient is a 21 year old female with past medical history of asthma presenting for complaints of cough.  Patient admits to cough, sore throat, fevers, chills, and body aches x4 days with worsening symptoms.  Patient denies any wheezing or chest tightness.  Takes albuterol as needed at home however ran out of prescription 2 days ago. ? ?The history is provided by the patient and a parent. No language interpreter was used.  ?Cough ?Associated symptoms: chills, fever, myalgias, shortness of breath and sore throat   ?Associated symptoms: no chest pain, no ear pain and no rash   ? ?  ? ?Home Medications ?Prior to Admission medications   ?Medication Sig Start Date End Date Taking? Authorizing Provider  ?albuterol (VENTOLIN HFA) 108 (90 Base) MCG/ACT inhaler Inhale 2 puffs into the lungs every 4 (four) hours as needed. 04/24/16   Pincus Large, DO  ?cetirizine HCl (ZYRTEC) 5 MG/5ML SYRP Take 5 mLs (5 mg total) by mouth daily. 11/13/13   Cherrie Distance, PA-C  ?polyethylene glycol (MIRALAX / GLYCOLAX) 17 g packet Take 17 g by mouth 2 (two) times daily. 03/01/20   Westley Chandler, MD  ?sertraline (ZOLOFT) 50 MG tablet Take 1 tablet (50 mg total) by mouth daily. 06/01/21   Alfredo Martinez, MD  ?   ? ?Allergies    ?Patient has no known allergies.   ? ?Review of Systems   ?Review of Systems  ?Constitutional:  Positive for chills and fever.  ?HENT:  Positive for congestion and sore throat. Negative for ear pain.   ?Eyes:  Negative for pain and visual disturbance.  ?Respiratory:  Positive for cough and shortness of breath.   ?Cardiovascular:  Negative for chest pain and palpitations.  ?Gastrointestinal:  Negative for abdominal pain and vomiting.  ?Genitourinary:  Negative for dysuria and hematuria.   ?Musculoskeletal:  Positive for myalgias. Negative for arthralgias, back pain, neck pain and neck stiffness.  ?Skin:  Negative for color change and rash.  ?Neurological:  Negative for seizures and syncope.  ?All other systems reviewed and are negative. ? ?Physical Exam ?Updated Vital Signs ?BP (!) 113/101 (BP Location: Right Arm)   Pulse 93   Temp 98.2 ?F (36.8 ?C) (Oral)   Resp 14   Ht 5' 2.5" (1.588 m)   Wt 58.1 kg   SpO2 100%   BMI 23.05 kg/m?  ?Physical Exam ?Vitals and nursing note reviewed.  ?Constitutional:   ?   General: She is not in acute distress. ?   Appearance: She is well-developed.  ?HENT:  ?   Head: Normocephalic and atraumatic.  ?   Right Ear: There is impacted cerumen.  ?   Left Ear: Tympanic membrane and external ear normal.  ?   Mouth/Throat:  ?   Lips: Pink.  ?   Mouth: Mucous membranes are moist.  ?   Pharynx: Oropharynx is clear. No pharyngeal swelling.  ?Eyes:  ?   Conjunctiva/sclera: Conjunctivae normal.  ?Cardiovascular:  ?   Rate and Rhythm: Normal rate and regular rhythm.  ?   Heart sounds: No murmur heard. ?Pulmonary:  ?   Effort: Pulmonary effort is normal. No respiratory distress.  ?   Breath sounds: Normal breath sounds.  ?Abdominal:  ?   Palpations: Abdomen is  soft.  ?   Tenderness: There is no abdominal tenderness.  ?Musculoskeletal:     ?   General: No swelling.  ?   Cervical back: Neck supple.  ?Skin: ?   General: Skin is warm and dry.  ?   Capillary Refill: Capillary refill takes less than 2 seconds.  ?Neurological:  ?   Mental Status: She is alert.  ?Psychiatric:     ?   Mood and Affect: Mood normal.  ? ? ?ED Results / Procedures / Treatments   ?Labs ?(all labs ordered are listed, but only abnormal results are displayed) ?Labs Reviewed  ?RESP PANEL BY RT-PCR (FLU A&B, COVID) [id number]  ?GROUP A STREP BY PCR  ? ? ?EKG ?None ? ?Radiology ?No results found. ? ?Procedures ?Procedures  ? ? ?Medications Ordered in ED ?Medications - No data to display ? ?ED Course/ Medical  Decision Making/ A&P ?  ?                        ?Medical Decision Making ?Amount and/or Complexity of Data Reviewed ?Radiology: ordered. ? ?Risk ?OTC drugs. ?Prescription drug management. ? ? ?Marland Kitchennow ?21 year old female with past medical history of asthma presenting for complaints of cough, fever, chills, sore throat, nasal congestion, and myalgias x4 days.  Patient is alert and oriented x3, no acute distress, afebrile, stable vital signs.  Physical exam demonstrates equal bilateral breath with no adventitious lung sounds.  No hypoxia.  No wheezing, however, patient with a chest tightness.  Albuterol puffer given in ED.  Motrin given for symptomatic management. ? ?Patient has impacted cerumen on right ear. Improved after saline irrigation. ? ?COVID, influenza, strep negative. ?Stable chest x-ray with no acute process.  No pneumonia. No otitis media or externa. ? ?Patient symptoms likely secondary to viral URI.  Patient recommended for rest, increase oral hydration, Motrin Tylenol for fevers or body aches, and albuterol use every 4 hours as needed for chest tightness or wheezing. Patient in no distress and overall condition improved here in the ED. Detailed discussions were had with the patient regarding current findings, and need for close f/u with PCP or on call doctor. The patient has been instructed to return immediately if the symptoms worsen in any way for re-evaluation. Patient verbalized understanding and is in agreement with current care plan. All questions answered prior to discharge. ? ? ? ? ? ? ? ?Final Clinical Impression(s) / ED Diagnoses ?Final diagnoses:  ?Viral URI with cough  ? ? ?Rx / DC Orders ?ED Discharge Orders   ? ? None  ? ?  ? ? ?  ?Franne Forts, DO ?12/01/21 0331 ? ?

## 2021-11-30 NOTE — ED Triage Notes (Signed)
Patient here POV from Home with URI Symptoms. ? ?Endorses Productive Cough, Sore Throat, Subjective Fevers, Chills, Body Aches for approximately 4 days. Worsening since it began. ? ?NAD Noted during Triage. A&Ox4. GCS 15. Ambulatory.  ?

## 2021-12-01 ENCOUNTER — Ambulatory Visit (INDEPENDENT_AMBULATORY_CARE_PROVIDER_SITE_OTHER): Payer: Medicaid Other | Admitting: Student

## 2021-12-01 ENCOUNTER — Emergency Department (HOSPITAL_BASED_OUTPATIENT_CLINIC_OR_DEPARTMENT_OTHER): Payer: Medicaid Other

## 2021-12-01 ENCOUNTER — Encounter: Payer: Self-pay | Admitting: Student

## 2021-12-01 VITALS — BP 106/72 | HR 86 | Wt 115.2 lb

## 2021-12-01 DIAGNOSIS — H9201 Otalgia, right ear: Secondary | ICD-10-CM | POA: Insufficient documentation

## 2021-12-01 DIAGNOSIS — R0602 Shortness of breath: Secondary | ICD-10-CM | POA: Diagnosis not present

## 2021-12-01 MED ORDER — AMOXICILLIN-POT CLAVULANATE 875-125 MG PO TABS: 1.0000 | ORAL_TABLET | Freq: Two times a day (BID) | ORAL | 0 refills | Status: DC

## 2021-12-01 MED ORDER — GUAIFENESIN 100 MG/5ML PO LIQD
10.0000 mL | Freq: Once | ORAL | Status: AC
  Administered 2021-12-01: 10 mL via ORAL
  Filled 2021-12-01: qty 10

## 2021-12-01 MED ORDER — IBUPROFEN 400 MG PO TABS
400.0000 mg | ORAL_TABLET | Freq: Once | ORAL | Status: AC
Start: 2021-12-01 — End: 2021-12-01
  Administered 2021-12-01: 400 mg via ORAL
  Filled 2021-12-01: qty 1

## 2021-12-01 MED ORDER — IBUPROFEN 800 MG PO TABS: 800.0000 mg | ORAL_TABLET | Freq: Three times a day (TID) | ORAL | 0 refills | Status: AC | PRN

## 2021-12-01 MED ORDER — ALBUTEROL SULFATE HFA 108 (90 BASE) MCG/ACT IN AERS
2.0000 | INHALATION_SPRAY | Freq: Once | RESPIRATORY_TRACT | Status: AC
  Administered 2021-12-01: 2 via RESPIRATORY_TRACT
  Filled 2021-12-01: qty 6.7

## 2021-12-01 MED ORDER — AEROCHAMBER PLUS FLO-VU MISC
1.0000 | Freq: Once | Status: AC
Start: 2021-12-01 — End: 2021-12-01
  Administered 2021-12-01: 1
  Filled 2021-12-01: qty 1

## 2021-12-01 MED ORDER — NEOMYCIN-POLYMYXIN-HC 1 % OT SOLN: 3.0000 [drp] | Freq: Four times a day (QID) | OTIC | 0 refills | Status: AC

## 2021-12-01 NOTE — ED Notes (Signed)
Pt needed further intervention of ear irrigation by elephant ear wash.  ?

## 2021-12-01 NOTE — Assessment & Plan Note (Signed)
1. Otalgia of right ear ?Will try Cortisporin drops for 7 days and reassess at that time for pain/hearing. ?- NEOMYCIN-POLYMYXIN-HYDROCORTISONE (CORTISPORIN) 1 % SOLN OTIC solution; Place 3 drops into the right ear 4 (four) times daily for 7 days.  Dispense: 10 mL; Refill: 0 ?- ibuprofen (ADVIL) 800 MG tablet; Take 1 tablet (800 mg total) by mouth every 8 (eight) hours as needed for moderate pain. Please do not take more frequently than every 8 hours for 10 days  Dispense: 60 tablet; Refill: 0 ?

## 2021-12-01 NOTE — Progress Notes (Signed)
? ? ?  SUBJECTIVE:  ? ?CHIEF COMPLAINT / HPI:  ? ?Was seen in ED yesterday for URI. Negative for Covid, flu and GAS at the time. Had her ear washed out at that time and says that it has been hurting badly and not able to hear much out of her right ear since then. Has put some peroxide in this morning. Denies any fevers.  ? ?Hearing Screening  ? 500Hz  1000Hz  2000Hz  4000Hz   ?Right ear Pass Pass Pass Pass  ?Left ear Pass     ?  ? ?PERTINENT  PMH / PSH: Asthma ? ?OBJECTIVE:  ? ?BP 106/72   Pulse 86   Wt 115 lb 4 oz (52.3 kg)   LMP 10/30/2021   SpO2 99%   BMI 20.74 kg/m?   ?General: nontoxic, awake, alert, responsive to questions ?Ears: R ear with red/dark brown wax somewhat occluding TM.  ?Skin: No rashes or lesions visualized  ? ?ASSESSMENT/PLAN:  ? ?Otalgia of right ear ?1. Otalgia of right ear ?Will try Cortisporin drops for 7 days and reassess at that time for pain/hearing. ?- NEOMYCIN-POLYMYXIN-HYDROCORTISONE (CORTISPORIN) 1 % SOLN OTIC solution; Place 3 drops into the right ear 4 (four) times daily for 7 days.  Dispense: 10 mL; Refill: 0 ?- ibuprofen (ADVIL) 800 MG tablet; Take 1 tablet (800 mg total) by mouth every 8 (eight) hours as needed for moderate pain. Please do not take more frequently than every 8 hours for 10 days  Dispense: 60 tablet; Refill: 0 ?  ? , MD ?[facility]  ?

## 2021-12-01 NOTE — Patient Instructions (Addendum)
It was great to see you! Thank you for allowing me to participate in your care!  ? ?Our plans for today:  ?- I have prescribed ear drops for you to put in your right ear 4 times daily for 7 days, please return in one week to see if your ear has improved ?-Please take tylenol as needed to help with pain ? ?Take care and seek immediate care sooner if you develop any concerns. Please remember to show up 15 minutes before your scheduled appointment time! ? ?Levin Erp, MD ? ?

## 2021-12-01 NOTE — ED Notes (Signed)
RT educated pt on proper use of MDI w/spacer. Pt able to perform without difficulty. Pt respiratory status stable w/no distress noted at this time. RT suggested pt see pulmonologist for PFT since pt has never had one for better control of symptoms.  ?

## 2021-12-01 NOTE — Discharge Instructions (Addendum)
Recommendations for rest, increase oral hydration, Motrin or Tylenol for fever or body aches, and albuterol use as needed for chest tightness or wheezing. ? ?If symptoms persist after 7-10 days please take antibiotic sent to pharmancy.  ?

## 2021-12-08 ENCOUNTER — Encounter: Payer: Self-pay | Admitting: Student

## 2021-12-08 ENCOUNTER — Ambulatory Visit (INDEPENDENT_AMBULATORY_CARE_PROVIDER_SITE_OTHER): Payer: Medicaid Other | Admitting: Student

## 2021-12-08 VITALS — BP 96/63 | HR 63 | Ht 62.5 in | Wt 119.2 lb

## 2021-12-08 DIAGNOSIS — H9191 Unspecified hearing loss, right ear: Secondary | ICD-10-CM

## 2021-12-08 NOTE — Progress Notes (Signed)
? ? ?  SUBJECTIVE:  ? ?CHIEF COMPLAINT / HPI: Right ear hearing loss ? ?Here for follow-up of right ear otalgia from last week.  Says that she has been using the cortisporin drops as prescribed and had some brown colored substance come out of her ear unsure if it was wax. Says that her pain has gone but she still has diminished hearing on the right. Denies any fevers.  ? ?Hearing Screening  ? 500Hz  4000Hz   ?Right ear Pass Pass  ?Left ear    ?  ? ?PERTINENT  PMH / PSH: Asthma ? ?OBJECTIVE:  ? ?BP 96/63   Pulse 63   Ht 5' 2.5" (1.588 m)   Wt 119 lb 3.2 oz (54.1 kg)   LMP 11/13/2021   SpO2 100%   BMI 21.45 kg/m?   ?General: Well appearing, NAD, awake, alert, responsive to questions ?Head: Normocephalic atraumatic ?Ear: TM clear bilaterally, no erythema or bulging. TM on right slightly wet appearing although after drops were placed. Canal appears normal bilaterally without ear wax. No pain on pinna movement ?CV: Regular rate and rhythm no murmurs rubs or gallops ?Respiratory: Clear to ausculation bilaterally, no wheezes rales or crackles, chest rises symmetrically,  no increased work of breathing ?Extremities: Moves upper and lower extremities freely, no edema in LE ?Neuro: No focal deficits ?Skin: No rashes or lesions visualized  ? ?ASSESSMENT/PLAN:  ? ?Hearing decreased, right ?No sign of ear infection or otitis externa today. No wax or pain. Hearing screen still shows diminished hearing on right. Will refer to ENT for further evaluation. ?  ? ? ? , MD ?[facility]  ?

## 2021-12-08 NOTE — Assessment & Plan Note (Addendum)
No sign of ear infection or otitis externa today. No wax or pain. Hearing screen still shows diminished hearing on right. Will refer to ENT for further evaluation. ?

## 2021-12-08 NOTE — Patient Instructions (Signed)
It was great to see you! Thank you for allowing me to participate in your care!  ? ?I recommend that you always bring your medications to each appointment as this makes it easy to ensure we are on the correct medications and helps [place] not miss when refills are needed. ? ?Our plans for today:  ?-Your urine does not look infected today.  I am referring to ear nose and throat doctor to figure out why you are still having hearing loss ? ?Take care and seek immediate care sooner if you develop any concerns. Please remember to show up 15 minutes before your scheduled appointment time! ? ?Levin Erp, MD ?[facility]  ?

## 2022-01-17 ENCOUNTER — Encounter: Payer: Self-pay | Admitting: *Deleted

## 2022-02-17 ENCOUNTER — Ambulatory Visit: Payer: Medicaid Other

## 2022-04-11 DIAGNOSIS — Z0000 Encounter for general adult medical examination without abnormal findings: Secondary | ICD-10-CM | POA: Diagnosis not present

## 2022-04-11 DIAGNOSIS — N946 Dysmenorrhea, unspecified: Secondary | ICD-10-CM | POA: Diagnosis not present

## 2022-04-11 DIAGNOSIS — Z01411 Encounter for gynecological examination (general) (routine) with abnormal findings: Secondary | ICD-10-CM | POA: Diagnosis not present

## 2022-04-11 DIAGNOSIS — Z113 Encounter for screening for infections with a predominantly sexual mode of transmission: Secondary | ICD-10-CM | POA: Diagnosis not present

## 2022-04-11 DIAGNOSIS — Z3040 Encounter for surveillance of contraceptives, unspecified: Secondary | ICD-10-CM | POA: Diagnosis not present

## 2022-04-11 DIAGNOSIS — Z0001 Encounter for general adult medical examination with abnormal findings: Secondary | ICD-10-CM | POA: Diagnosis not present

## 2022-04-11 DIAGNOSIS — Z6822 Body mass index (BMI) 22.0-22.9, adult: Secondary | ICD-10-CM | POA: Diagnosis not present

## 2022-04-11 DIAGNOSIS — N898 Other specified noninflammatory disorders of vagina: Secondary | ICD-10-CM | POA: Diagnosis not present

## 2022-05-19 ENCOUNTER — Other Ambulatory Visit (HOSPITAL_COMMUNITY)
Admission: RE | Admit: 2022-05-19 | Discharge: 2022-05-19 | Disposition: A | Discharge status: Home / Self Care | Payer: Medicaid Other | Source: Ambulatory Visit | Attending: Family Medicine | Admitting: Family Medicine

## 2022-05-19 ENCOUNTER — Telehealth: Payer: Self-pay | Admitting: Family Medicine

## 2022-05-19 ENCOUNTER — Ambulatory Visit (INDEPENDENT_AMBULATORY_CARE_PROVIDER_SITE_OTHER): Payer: Medicaid Other | Admitting: Family Medicine

## 2022-05-19 VITALS — BP 98/60 | HR 83 | Temp 99.2°F | Ht 62.5 in | Wt 125.6 lb

## 2022-05-19 DIAGNOSIS — L989 Disorder of the skin and subcutaneous tissue, unspecified: Secondary | ICD-10-CM | POA: Insufficient documentation

## 2022-05-19 DIAGNOSIS — Z113 Encounter for screening for infections with a predominantly sexual mode of transmission: Secondary | ICD-10-CM | POA: Insufficient documentation

## 2022-05-19 DIAGNOSIS — N760 Acute vaginitis: Secondary | ICD-10-CM

## 2022-05-19 DIAGNOSIS — B9689 Other specified bacterial agents as the cause of diseases classified elsewhere: Secondary | ICD-10-CM

## 2022-05-19 LAB — POCT URINE PREGNANCY: Preg Test, Ur: NEGATIVE

## 2022-05-19 LAB — POCT WET PREP (WET MOUNT)
Clue Cells Wet Prep Whiff POC: POSITIVE
Trichomonas Wet Prep HPF POC: ABSENT

## 2022-05-19 MED ORDER — METRONIDAZOLE 500 MG PO TABS: 500.0000 mg | ORAL_TABLET | Freq: Two times a day (BID) | ORAL | 0 refills | Status: AC

## 2022-05-19 NOTE — Assessment & Plan Note (Signed)
This appears to be an ingrown hair of the left labia majora.  Discussed avoiding waxing or shaving as this could cause more ingrown hairs.  Patient amenable to STI testing, will await those results.

## 2022-05-19 NOTE — Telephone Encounter (Signed)
Attempted to call patient regarding her results positive for bacterial vaginosis.  No answer, left voicemail.  I will prescribe oral metronidazole 500 mg twice daily x7 days.  Ezequiel Essex, MD

## 2022-05-19 NOTE — Assessment & Plan Note (Signed)
Urine pregnancy negative.  Wet prep negative for trichomonas.  We will follow results for GC, CH, HIV, RPR.

## 2022-05-19 NOTE — Progress Notes (Signed)
    SUBJECTIVE:   CHIEF COMPLAINT / HPI:   Vaginal complaint Ms. Witman is a pleasant 21 year old woman who presents today for evaluation of a lesion on her vulva.  She reports it has been present for 1 week.  No known swelling, warmth, or discharge. She believes it is an ingrown hair but would like to be sure.  She also requests STI screenings and a urine pregnancy test at this time.  -Concern for specific exposure? No -Current symptoms: Vulvar lesion -Denies abnormal discharge, vaginal pruritus, abnormal odor, dysuria -Contraception: None -PAP: Not yet performed, patient is 78  PERTINENT  PMH / PSH: BV, irregular periods, asthma, cannabis use  OBJECTIVE:   BP 98/60   Pulse 83   Temp 99.2 F (37.3 C)   Ht 5' 2.5" (1.588 m)   Wt 125 lb 9.6 oz (57 kg)   SpO2 98%   BMI 22.61 kg/m    Physical Exam General: Awake, alert, oriented, no acute distress Respiratory: Normal work of breathing, no respiratory distress Neuro: Cranial nerves II through X grossly intact, able to move all extremities spontaneously Vulva: Normal appearing vulva, 1 isolated lesion overlying left midline labia majora: Solid erythematous lesion without surrounding erythema or swelling, appears to be, normal in nature at the base of a hair follicle.  No streaking or discharge present. Vagina: Pale pink rugated vaginal tissue without obvious lesions, physiologic discharge of whitish color, cervix without lesion or overt tenderness with swab  Sensitive exam performed with chaperone in the room:  Mercer Pod, CMA   ASSESSMENT/PLAN:   Routine screening for STI (sexually transmitted infection) Urine pregnancy negative.  Wet prep negative for trichomonas.  We will follow results for GC, CH, HIV, RPR.  Skin lesion This appears to be an ingrown hair of the left labia majora.  Discussed avoiding waxing or shaving as this could cause more ingrown hairs.  Patient amenable to STI testing, will await those results.      Ezequiel Essex, MD [place]

## 2022-05-19 NOTE — Patient Instructions (Signed)
It was wonderful to meet you today. Thank you for allowing me to be a part of your care. Below is a short summary of what we discussed at your visit today:  STI screening - Today we obtained a vaginal swab to screen for gonorrhea, chlamydia, and trichomonas - We also obtained a blood sample to screen for HIV and syphilis - As above, if the results are normal, I will send you a letter or MyChart message. If the results are abnormal, I will give you a call.     Pregnancy test Today we obtained a urine sample to test for pregnancy.  The result will be on your MyChart when it is ready.   Please bring all of your medications to every appointment!  If you have any questions or concerns, please do not hesitate to contact us via phone or MyChart message.   Ezequiel Essex, MD

## 2022-05-20 LAB — HIV ANTIBODY (ROUTINE TESTING W REFLEX): HIV Screen 4th Generation wRfx: NONREACTIVE

## 2022-05-20 LAB — RPR: RPR Ser Ql: NONREACTIVE

## 2022-05-21 ENCOUNTER — Encounter: Payer: Self-pay | Admitting: Family Medicine

## 2022-05-22 LAB — CERVICOVAGINAL ANCILLARY ONLY
Chlamydia: NEGATIVE
Comment: NEGATIVE
Comment: NORMAL
Neisseria Gonorrhea: NEGATIVE

## 2022-08-08 ENCOUNTER — Telehealth: Payer: Medicaid Other | Admitting: Family Medicine

## 2022-08-08 ENCOUNTER — Encounter: Payer: Self-pay | Admitting: Student

## 2022-08-08 DIAGNOSIS — Z3200 Encounter for pregnancy test, result unknown: Secondary | ICD-10-CM

## 2022-08-08 DIAGNOSIS — N898 Other specified noninflammatory disorders of vagina: Secondary | ICD-10-CM

## 2022-08-08 NOTE — Progress Notes (Signed)
Because possible pregnancy and new partner sexually- you need to have urine pregnancy test and swabs vaginally to testing of infection, I feel your condition warrants further evaluation and I recommend that you be seen in a face to face visit.   NOTE: There will be NO CHARGE for this eVisit   If you are having a true medical emergency please call 911.      For an urgent face to face visit, [place] has seven urgent care centers for your convenience:     [facility] at [facility] Directions [phone] [address] [place], [place] [address]    [facility]) Get Driving Directions [phone] [address] [place], [place] [address]  [facility] Center [facility]) Get Driving Directions [phone] [address] [place],  [place]  [address]  [facility]gent Care Center [facility] - at [employer] Directions  [phone] [address] [place],  [place] [address]   [facility] Urgent Care at [facility] Get Driving Directions [phone] [address] [place], [place] [address]   [facility] Urgent Care at [facility] Get Driving Directions  [phone] [address] [place], [place] [address]   [facility] Care at [facility] Directions [phone] [address] [place], [place] [address]  Your MyChart E-visit questionnaire answers were reviewed by a board certified advanced clinical practitioner to complete your personal care plan based on your specific symptoms.  Thank you for using e-Visits.

## 2022-08-09 ENCOUNTER — Telehealth: Payer: Self-pay | Admitting: Family Medicine
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
## 2022-08-10 ENCOUNTER — Encounter: Payer: Self-pay | Admitting: Student

## 2022-08-10 ENCOUNTER — Ambulatory Visit: Payer: Medicaid Other | Admitting: Student

## 2022-08-10 ENCOUNTER — Other Ambulatory Visit (HOSPITAL_COMMUNITY)
Admission: RE | Admit: 2022-08-10 | Discharge: 2022-08-10 | Disposition: A | Discharge status: Home / Self Care | Payer: Medicaid Other | Source: Ambulatory Visit | Attending: Family Medicine | Admitting: Family Medicine

## 2022-08-10 VITALS — BP 117/56 | HR 75 | Wt 122.8 lb

## 2022-08-10 DIAGNOSIS — Z124 Encounter for screening for malignant neoplasm of cervix: Secondary | ICD-10-CM | POA: Diagnosis not present

## 2022-08-10 DIAGNOSIS — R112 Nausea with vomiting, unspecified: Secondary | ICD-10-CM | POA: Diagnosis not present

## 2022-08-10 DIAGNOSIS — N898 Other specified noninflammatory disorders of vagina: Secondary | ICD-10-CM

## 2022-08-10 DIAGNOSIS — B3731 Acute candidiasis of vulva and vagina: Secondary | ICD-10-CM

## 2022-08-10 DIAGNOSIS — N912 Amenorrhea, unspecified: Secondary | ICD-10-CM

## 2022-08-10 DIAGNOSIS — T7421XA Adult sexual abuse, confirmed, initial encounter: Secondary | ICD-10-CM

## 2022-08-10 LAB — POCT WET PREP (WET MOUNT)
Clue Cells Wet Prep Whiff POC: NEGATIVE
Trichomonas Wet Prep HPF POC: ABSENT

## 2022-08-10 LAB — POCT URINE PREGNANCY: Preg Test, Ur: POSITIVE — AB

## 2022-08-10 MED ORDER — FLUCONAZOLE 150 MG PO TABS: 150.0000 mg | ORAL_TABLET | Freq: Once | ORAL | 0 refills | Status: AC

## 2022-08-10 MED ORDER — SERTRALINE HCL 50 MG PO TABS: 50.0000 mg | ORAL_TABLET | Freq: Every day | ORAL | 3 refills | Status: DC

## 2022-08-10 NOTE — Patient Instructions (Signed)
It was great to see you! Thank you for allowing me to participate in your care!   I recommend that you always bring your medications to each appointment as this makes it easy to ensure we are on the correct medications and helps [place] not miss when refills are needed.  Our plans for today:  -I have refilled you zoloft- follow up in 4 weeks or sooner to see how you are doing  We are checking some labs today, I will call you if they are abnormal will send you a MyChart message or a letter if they are normal.  If you do not hear about your labs in the next 2 weeks please let us know.  Take care and seek immediate care sooner if you develop any concerns. Please remember to show up 15 minutes before your scheduled appointment time!  Levin Erp, MD [facility]  If you do not desire this pregnancy, I recommend you reach out to :   [facility] [place] [address] [place], [place] [address]  [email or web address] [phone]  [facility] clinics offer safe, effective first and second-trimester abortion care. These services include the abortion pill and procedural abortion care options--in [place] through 12.6 weeks. To be in compliance with the state law, patients will be required to receive state-mandated information and consent in-person 72 hours before an abortion. State law requires at least two separate trips to their facility. They are able to provide abortion care through 12 weeks of pregnancy.     Therapy and Counseling Resources Most providers on this list will take Medicaid. Patients with commercial insurance or Medicare should contact their insurance company to get a list of in network providers.  [facility] (takes children) Location 1: [address], [address] [place], [place] [address] Location 2: [address] [place], [place] [address] [phone]   [facility] (spanish speaking therapist available)(habla  espanol)(take medicare and medicaid)  [address], [place], [place] [address], [email or web address] [email or web address] [phone]  [facility]iat[facility] Counseling [address] [place], [place] [address] [phone]  [facility]   [address], [address] [place], [place] [address]      [phone]  [facility] (spanish available) [address]  [place], [place] [address] [phone]  [facility] (take [facility] and medicare) [address]  [place], [place] [address]       [phone]     [facility] (virtual only) [phone]  [facility]ov[facility]uss[facility] [address] [place], [place] [phone]  [facility]:  [address] [place] [phone]  [facility]:  [address] [place] [phone]  [facility] (under & uninsured) [address]   [place] [phone]    [email or web address]    [facility] [address]  [place]    [phone]  [facility] -[address]     Phone:  [phone]     [facility]-  [address]  [phone]   [facility] [address]      [place], [place] [phone]  [facility]: [address] [place], [place], [place]  [phone]   [facility] (Spanish therapist) [email or web address]  [address] [place]  [address]   [place] [address]    [phone]    [facility]  [address],  [place], [place]  [phone]   [facility]  [address] [place]  [phone]  [facility]  [address] [place], [place]        [phone]  Open Access/Walk In Clinic under & uninsured  [facility]  [address] [place], [place] [phone] Crisis  [phone]  [facility] [place],  (Spanish)   [address], [place]: [phone]) 8:30 - 12; 1 - 2:30  [facility] [employer],  [address] [place], [place]    [phone]):8:30 - 12; 2 - 3PM  [facility] [employer],  [address]  [place]; [phone]):   Mon - Fri 8 AM - 5 PM  [facility] [address] [place]  MWF 12:30 to 3:00 or call to schedule an appointment  [phone]  Specific Provider options Psychology Today  [email or web address] click on find a therapist  enter your zip code left side and select or tailor a therapist for your specific need.   [facility] Provider Directory [email or web address]  (Medicaid)   Follow all drop down to find a provider  Social Support program [facility] [place] [phone] or PhotoSolver.pl [address], [place], [place] Recovery support and educational   24- Hour Availability:   [facility]  [address] [place], [place] [phone] Crisis [phone]  [facility] [phone]  [facility]  [phone]   [facility]  [phone] (after hours)  Therapeutic Alternative/Mobile Crisis   [phone]  [place] National Suicide Hotline  [phone] Len Childs)  Call 911 or go to emergency room  [facility]  [phone]);  [place] and [employer]  [phone]); [place], [place], [place], [place], [place], [place], [place]
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
## 2022-08-11 ENCOUNTER — Encounter: Payer: Self-pay | Admitting: Student

## 2022-08-11 DIAGNOSIS — R110 Nausea: Secondary | ICD-10-CM

## 2022-08-11 LAB — CBC WITH DIFFERENTIAL/PLATELET
Basophils Absolute: 0 10*3/uL (ref 0.0–0.2)
Basos: 0 %
EOS (ABSOLUTE): 0 10*3/uL (ref 0.0–0.4)
Eos: 1 %
Hematocrit: 36.7 % (ref 34.0–46.6)
Hemoglobin: 11.5 g/dL (ref 11.1–15.9)
Immature Grans (Abs): 0 10*3/uL (ref 0.0–0.1)
Immature Granulocytes: 0 %
Lymphocytes Absolute: 1.8 10*3/uL (ref 0.7–3.1)
Lymphs: 33 %
MCH: 23 pg — ABNORMAL LOW (ref 26.6–33.0)
MCHC: 31.3 g/dL — ABNORMAL LOW (ref 31.5–35.7)
MCV: 74 fL — ABNORMAL LOW (ref 79–97)
Monocytes Absolute: 0.5 10*3/uL (ref 0.1–0.9)
Monocytes: 8 %
Neutrophils Absolute: 3.2 10*3/uL (ref 1.4–7.0)
Neutrophils: 58 %
Platelets: 296 10*3/uL (ref 150–450)
RBC: 4.99 x10E6/uL (ref 3.77–5.28)
RDW: 14.6 % (ref 11.7–15.4)
WBC: 5.5 10*3/uL (ref 3.4–10.8)

## 2022-08-11 LAB — COMPREHENSIVE METABOLIC PANEL
ALT: 6 IU/L (ref 0–32)
AST: 15 IU/L (ref 0–40)
Albumin/Globulin Ratio: 2.1 (ref 1.2–2.2)
Albumin: 4.6 g/dL (ref 4.0–5.0)
Alkaline Phosphatase: 83 IU/L (ref 44–121)
BUN/Creatinine Ratio: 17 (ref 9–23)
BUN: 11 mg/dL (ref 6–20)
Bilirubin Total: 0.5 mg/dL (ref 0.0–1.2)
CO2: 17 mmol/L — ABNORMAL LOW (ref 20–29)
Calcium: 9.5 mg/dL (ref 8.7–10.2)
Chloride: 104 mmol/L (ref 96–106)
Creatinine, Ser: 0.64 mg/dL (ref 0.57–1.00)
Globulin, Total: 2.2 g/dL (ref 1.5–4.5)
Glucose: 66 mg/dL — ABNORMAL LOW (ref 70–99)
Potassium: 3.5 mmol/L (ref 3.5–5.2)
Sodium: 138 mmol/L (ref 134–144)
Total Protein: 6.8 g/dL (ref 6.0–8.5)
eGFR: 129 mL/min/{1.73_m2} (ref >59)

## 2022-08-11 LAB — RPR: RPR Ser Ql: NONREACTIVE

## 2022-08-11 LAB — HIV ANTIBODY (ROUTINE TESTING W REFLEX): HIV Screen 4th Generation wRfx: NONREACTIVE

## 2022-08-11 MED ORDER — ONDANSETRON 8 MG PO TBDP: 8.0000 mg | ORAL_TABLET | Freq: Three times a day (TID) | ORAL | 0 refills | Status: AC | PRN

## 2022-08-15 LAB — CYTOLOGY - PAP
Chlamydia: POSITIVE — AB
Comment: NEGATIVE
Comment: NEGATIVE
Comment: NORMAL
Diagnosis: NEGATIVE
Neisseria Gonorrhea: NEGATIVE
Trichomonas: NEGATIVE

## 2022-08-17 ENCOUNTER — Telehealth: Payer: Self-pay

## 2022-08-17 NOTE — Telephone Encounter (Signed)
Faxed notification to [facility] /ATTN Brandy Sessoms.  .Milos Milligan R Jaleea Alesi, CMA  

## 2022-08-17 NOTE — Telephone Encounter (Signed)
-----   Message from [place] Pink, [place] sent at 08/16/2022 10:44 AM EST ----- Regarding: STI reporting Positive chlamydia

## 2022-08-21 ENCOUNTER — Ambulatory Visit (INDEPENDENT_AMBULATORY_CARE_PROVIDER_SITE_OTHER): Payer: Medicaid Other

## 2022-08-21 ENCOUNTER — Ambulatory Visit: Payer: Medicaid Other

## 2022-08-21 DIAGNOSIS — A749 Chlamydial infection, unspecified: Secondary | ICD-10-CM

## 2022-08-21 MED ORDER — AZITHROMYCIN 500 MG PO TABS
1000.0000 mg | ORAL_TABLET | Freq: Once | ORAL | Status: AC
  Administered 2022-08-21: 1000 mg via ORAL

## 2022-08-21 NOTE — Progress Notes (Signed)
Patient in nurse clinic today for STD treatment of chlaymydia.  Verified that patient has no known drug allergies.   Spoke with Dr. Jinny Sanders regarding treatment. Received order to administer 1 gram Azithromycin PO x 1.   Azithromycin 1 GM PO x 1 given  Dr. Annamary Carolin orders.    Patient has follow up visit with Dr. Jinny Sanders on 1/24.  Talbot Grumbling, RN

## 2022-08-30 ENCOUNTER — Emergency Department (HOSPITAL_COMMUNITY)
Admission: EM | Admit: 2022-08-30 | Discharge: 2022-08-30 | Discharge status: Against Medical Advice | Payer: Medicaid Other

## 2022-08-30 NOTE — ED Notes (Signed)
Pt called for triage, no response. 

## 2022-08-30 NOTE — ED Triage Notes (Signed)
The pt did not answer through out the waiting area

## 2022-09-01 ENCOUNTER — Ambulatory Visit: Payer: Medicaid Other | Admitting: Internal Medicine

## 2022-09-05 ENCOUNTER — Telehealth: Payer: Self-pay | Admitting: *Deleted

## 2022-09-05 DIAGNOSIS — J45909 Unspecified asthma, uncomplicated: Secondary | ICD-10-CM

## 2022-09-05 NOTE — Telephone Encounter (Signed)
[facility] and needs a referral placed.  Will forward to MD.  Caitlyn Campos

## 2022-09-06 ENCOUNTER — Ambulatory Visit: Payer: Medicaid Other | Admitting: Student

## 2022-09-06 NOTE — Progress Notes (Deleted)
    SUBJECTIVE:   CHIEF COMPLAINT / HPI:   ***  PERTINENT  PMH / PSH: ***  OBJECTIVE:   There were no vitals taken for this visit.  ***  ASSESSMENT/PLAN:   No problem-specific Assessment & Plan notes found for this encounter.     Robi Dewolfe, MD [facility]  

## 2022-09-13 ENCOUNTER — Ambulatory Visit: Payer: Medicaid Other | Admitting: Student

## 2022-09-13 NOTE — Progress Notes (Deleted)
    SUBJECTIVE:   CHIEF COMPLAINT / HPI:   ***  PERTINENT  PMH / PSH: ***  OBJECTIVE:   There were no vitals taken for this visit.  ***  ASSESSMENT/PLAN:   No problem-specific Assessment & Plan notes found for this encounter.     Harrell Niehoff, MD [facility]  

## 2022-09-26 ENCOUNTER — Ambulatory Visit: Payer: Medicaid Other | Admitting: Internal Medicine

## 2022-09-27 ENCOUNTER — Ambulatory Visit: Payer: Medicaid Other | Admitting: Allergy

## 2022-09-27 ENCOUNTER — Encounter: Payer: Self-pay | Admitting: Allergy

## 2022-09-27 ENCOUNTER — Other Ambulatory Visit: Payer: Self-pay

## 2022-09-27 VITALS — BP 90/60 | HR 75 | Temp 98.5°F | Resp 18 | Ht 63.0 in | Wt 125.5 lb

## 2022-09-27 DIAGNOSIS — J45990 Exercise induced bronchospasm: Secondary | ICD-10-CM

## 2022-09-27 DIAGNOSIS — L500 Allergic urticaria: Secondary | ICD-10-CM | POA: Diagnosis not present

## 2022-09-27 MED ORDER — VENTOLIN HFA 108 (90 BASE) MCG/ACT IN AERS: 2.0000 | INHALATION_SPRAY | RESPIRATORY_TRACT | 1 refills | Status: DC | PRN

## 2022-09-27 NOTE — Progress Notes (Signed)
New Patient Note  RE: Caitlyn Campos MRN: [id number] DOB: 11-Jul-2001 Date of Office Visit: 09/27/2022  Primary care provider: Erskine Emery, MD  Chief Complaint: hives  History of present illness: Caitlyn Campos is a 22 y.o. female presenting today for evaluation of hives.   She states she broke out in hives about 2-3 weeks ago.  She states she was at a restaurant and states her meal was hibachi shrimp, salmon, rice, onions.  She recalls about 30 minutes to 1 hour after eating she had the hives.  She states she is really only concerned about the shrimp.  She states she went to the ED however was told she would have to wait 15 hours so she went home and took benadryl and took shower and went to sleep.  The hives resolved. Denies any swelling, respiratory, GI or CV related symptoms.  She states she was scratching the hives at the time so bad that she did break the skin.  She denies any bruising from the hives once resolved.  She states she has had hives once before after eating shrimp about 2-3 years ago.  However she states she can eat certain type of shrimp like popcorn she feels without issues.   She would like to see if she is allergic to shrimp.     She has a twin sister with a shrimp allergy.  She does not recall having any other food reactions.    She denies any symptoms of allergic rhinoconjunctivitis.   Denies history of eczema.   She has a history of childhood asthma.   She state she would normally have symptoms with exercise.  She does use albuterol prior to activity to prevent onset of symptoms.  She currently does not have an albuterol inhaler as has been out for about 2 weeks.     Review of systems: Review of Systems  Constitutional: Negative.   HENT: Negative.    Eyes: Negative.   Respiratory: Negative.    Cardiovascular: Negative.   Gastrointestinal: Negative.   Musculoskeletal: Negative.   Skin:  Positive for rash.  Allergic/Immunologic: Negative.   Neurological:  Negative.     All other systems negative unless noted above in HPI  Past medical history: Past Medical History:  Diagnosis Date   Asthma    CHILDHOOD   History of hydrocephalus    resolved per mother   History of hydronephrosis    resolved per mother   History of viral illness    hospitalized age 79 year for 1 week   PONV (postoperative nausea and vomiting)    pt takes zofran at home as needed for nausea from stress    Past surgical history: Past Surgical History:  Procedure Laterality Date   BREAST REDUCTION SURGERY Bilateral 03/30/2020   Procedure: BILATERAL BREAST REDUCTION;  Surgeon: Irene Limbo, MD;  Location: [facility];  Service: Plastics;  Laterality: Bilateral;    Family history:  Family History  Problem Relation Age of Onset   Asthma Mother    Depression Mother    Eczema Sister     Social history: Lives in a home with carpeting with electric heating and central cooling.  No pets in the home.  Dogs and cats outside the home.  No concern for roaches in the home.  There is concern for water damage/mildew in the home.  She is a Training and development officer.  She denies a smoking history currently.   Medication List: Current Outpatient Medications  Medication Sig Dispense Refill  albuterol (VENTOLIN HFA) 108 (90 Base) MCG/ACT inhaler Inhale 2 puffs into the lungs every 4 (four) hours as needed. 2 Inhaler 1   cetirizine HCl (ZYRTEC) 5 MG/5ML SYRP Take 5 mLs (5 mg total) by mouth daily. 120 mL 0   ondansetron (ZOFRAN-ODT) 8 MG disintegrating tablet Take 1 tablet (8 mg total) by mouth every 8 (eight) hours as needed for nausea or vomiting. 20 tablet 0   polyethylene glycol (MIRALAX / GLYCOLAX) 17 g packet Take 17 g by mouth 2 (two) times daily. 100 each 3   sertraline (ZOLOFT) 50 MG tablet Take 1 tablet (50 mg total) by mouth daily. 30 tablet 3   No current facility-administered medications for this visit.    Known medication allergies: No Known  Allergies   Physical examination: Blood pressure 90/60, pulse 75, temperature 98.5 F (36.9 C), temperature source Temporal, resp. rate 18, height 5' 3"$  (1.6 m), weight 125 lb 8 oz (56.9 kg), SpO2 99 %.  General: Alert, interactive, in no acute distress. HEENT: PERRLA, TMs pearly gray, turbinates non-edematous without discharge, post-pharynx non erythematous. Neck: Supple without lymphadenopathy. Lungs: Clear to auscultation without wheezing, rhonchi or rales. {no increased work of breathing. CV: Normal S1, S2 without murmurs. Abdomen: Nondistended, nontender. Skin: Warm and dry, without lesions or rashes. Extremities:  No clubbing, cyanosis or edema. Neuro:   Grossly intact.  Diagnositics/Labs  Spirometry: FEV1: 2.84L 103%, FVC: 3.27L 104%, ratio consistent with nonobstructive pattern  Allergy testing:   Food Adult Perc - 09/27/22 1000     Time Antigen Placed 1004    Allergen Manufacturer Lavella Hammock    Location Back    Number of allergen test 15     Control-buffer 50% Glycerol Negative    Control-Histamine 1 mg/ml 2+    18. Catfish Negative    19. Bass Negative    20. Trout Negative    21. Tuna Negative    22. Salmon Negative    23. Flounder Negative    24. Codfish Negative    25. Shrimp Negative    26. Crab Negative    27. Lobster Negative    28. Oyster Negative    29. Scallops Negative    49. Onion Negative             Allergy testing results were read and interpreted by provider, documented by clinical staff.   Assessment and plan: Hives, allergic  - skin testing for fish, shellfish and onion today are negative  - will obtain serum IgE levels for these foods and if negative will recommend in-office food challenge  - if testing is positive then will prescribe you an epipen to have on hand in case of allergic reaction  - hives can be caused by a variety of different triggers including illness/infection, foods, medications, stings, exercise, pressure, vibrations,  extremes of temperature to name a few however majority of the time there is no identifiable trigger.    - should significant symptoms recur or new symptoms occur, a journal is to be kept recording any foods eaten, beverages consumed, medications taken, activities performed, and environmental conditions within a 6 hour time period prior to the onset of symptoms.   - should hives return recommend taking antihistamine like Zyrtec 1 tab 1-2 times a day as needed for hive control  Exercise induced asthma  - lung function testing is normal today  - have access to albuterol inhaler 2 puffs every 4-6 hours as needed for cough/wheeze/shortness of breath/chest tightness.  May use  15-20 minutes prior to activity.   Monitor frequency of use.     Follow-up in 4-6 months or sooner if needed  I appreciate the opportunity to take part in [place] care. Please do not hesitate to contact me with questions.  Sincerely,   Prudy Feeler, MD Allergy/Immunology [facility] [place] [facility] [place]

## 2022-09-27 NOTE — Patient Instructions (Addendum)
Hives, allergic  - skin testing for fish, shellfish and onion today are negative  - will obtain serum IgE levels for these foods and if negative will recommend in-office food challenge  - if testing is positive then will prescribe you an epipen to have on hand in case of allergic reaction  - hives can be caused by a variety of different triggers including illness/infection, foods, medications, stings, exercise, pressure, vibrations, extremes of temperature to name a few however majority of the time there is no identifiable trigger.    - should significant symptoms recur or new symptoms occur, a journal is to be kept recording any foods eaten, beverages consumed, medications taken, activities performed, and environmental conditions within a 6 hour time period prior to the onset of symptoms.   - should hives return recommend taking antihistamine like Zyrtec 1 tab 1-2 times a day as needed for hive control  Exercise induced asthma  - lung function testing is normal today  - have access to albuterol inhaler 2 puffs every 4-6 hours as needed for cough/wheeze/shortness of breath/chest tightness.  May use 15-20 minutes prior to activity.   Monitor frequency of use.     Follow-up in 4-6 months or sooner if needed

## 2022-09-30 LAB — TRYPTASE: Tryptase: 4.2 ug/L (ref 2.2–13.2)

## 2022-09-30 LAB — ALLERGEN PROFILE, FOOD-FISH
Allergen Mackerel IgE: <0.1 kU/L
Allergen Salmon IgE: <0.1 kU/L
Allergen Trout IgE: <0.1 kU/L
Allergen Walley Pike IgE: <0.1 kU/L
Codfish IgE: <0.1 kU/L
Halibut IgE: <0.1 kU/L
Tuna: <0.1 kU/L

## 2022-09-30 LAB — ALLERGEN PROFILE, SHELLFISH
Clam IgE: <0.1 kU/L
F023-IgE Crab: 7.61 kU/L — AB
F080-IgE Lobster: 4.56 kU/L — AB
F290-IgE Oyster: 0.2 kU/L — AB
Scallop IgE: 0.17 kU/L — AB
Shrimp IgE: 8.7 kU/L — AB

## 2022-09-30 LAB — ALLERGEN, ONION, F48: Allergen Onion IgE: 0.2 kU/L — AB

## 2022-09-30 LAB — ALPHA-GAL PANEL
Allergen Lamb IgE: <0.1 kU/L
Beef IgE: 0.11 kU/L — AB
IgE (Immunoglobulin E), Serum: 644 IU/mL — ABNORMAL HIGH (ref 6–495)
O215-IgE Alpha-Gal: <0.1 kU/L
Pork IgE: <0.1 kU/L

## 2023-03-28 ENCOUNTER — Ambulatory Visit: Payer: Medicaid Other | Admitting: Allergy

## 2023-04-05 ENCOUNTER — Encounter: Payer: Self-pay | Admitting: Family Medicine

## 2023-04-05 ENCOUNTER — Ambulatory Visit (INDEPENDENT_AMBULATORY_CARE_PROVIDER_SITE_OTHER): Payer: Medicaid Other | Admitting: Family Medicine

## 2023-04-05 VITALS — BP 109/75 | HR 59 | Ht 62.0 in | Wt 120.2 lb

## 2023-04-05 DIAGNOSIS — R399 Unspecified symptoms and signs involving the genitourinary system: Secondary | ICD-10-CM

## 2023-04-05 DIAGNOSIS — J4520 Mild intermittent asthma, uncomplicated: Secondary | ICD-10-CM | POA: Diagnosis not present

## 2023-04-05 DIAGNOSIS — Z23 Encounter for immunization: Secondary | ICD-10-CM

## 2023-04-05 DIAGNOSIS — F329 Major depressive disorder, single episode, unspecified: Secondary | ICD-10-CM | POA: Diagnosis not present

## 2023-04-05 LAB — POCT URINALYSIS DIP (MANUAL ENTRY)
Bilirubin, UA: NEGATIVE
Glucose, UA: NEGATIVE mg/dL
Ketones, POC UA: NEGATIVE mg/dL
Nitrite, UA: NEGATIVE
Protein Ur, POC: NEGATIVE mg/dL
Spec Grav, UA: 1.02 (ref 1.010–1.025)
Urobilinogen, UA: 0.2 E.U./dL
pH, UA: 7 (ref 5.0–8.0)

## 2023-04-05 LAB — POCT UA - MICROSCOPIC ONLY: RBC, Urine, Miroscopic: >20 (ref 0–2)

## 2023-04-05 MED ORDER — VENTOLIN HFA 108 (90 BASE) MCG/ACT IN AERS: 2.0000 | INHALATION_SPRAY | Freq: Four times a day (QID) | RESPIRATORY_TRACT | 1 refills | Status: AC | PRN

## 2023-04-05 MED ORDER — CEPHALEXIN 500 MG PO CAPS: 500.0000 mg | ORAL_CAPSULE | Freq: Two times a day (BID) | ORAL | 0 refills | Status: AC

## 2023-04-05 MED ORDER — ESCITALOPRAM OXALATE 10 MG PO TABS: 10.0000 mg | ORAL_TABLET | Freq: Every day | ORAL | 1 refills | Status: AC

## 2023-04-05 NOTE — Assessment & Plan Note (Signed)
Stable.  Albuterol refilled. ?

## 2023-04-05 NOTE — Progress Notes (Signed)
    SUBJECTIVE:   CHIEF COMPLAINT / HPI:   Medication Refill Chronicity: Need her Albuterol refilled.  Dysuria  This is a new problem. Episode onset: This started 1 week ago. The problem occurs every urination. The problem has been unchanged. The quality of the pain is described as burning. Pertinent negatives include no frequency or possible pregnancy. Associated symptoms comments: Occasional vaginal discharge. LMP 04/01/23. She is currently on her period.. She has tried nothing for the symptoms. PHx of UTI, last epside was 2 yrs ago   Depression: This is a chronic problem. She was on Sertraline for this, which she self d/ced due to intolerance and non-effectiveness about 6-7 months ago. She is unwilling to try a higher dose of Sertraline for effectiveness. She denies SI/HI.   PERTINENT  PMH / PSH: PMHx reviewed  OBJECTIVE:   BP 109/75   Pulse (!) 59   Ht 5\' 2"  (1.575 m)   Wt 120 lb 4 oz (54.5 kg)   LMP 04/01/2023   SpO2 99%   BMI 21.99 kg/m   Physical Exam Vitals and nursing note reviewed.  Cardiovascular:     Rate and Rhythm: Normal rate and regular rhythm.     Heart sounds: Normal heart sounds. No murmur heard. Pulmonary:     Effort: Pulmonary effort is normal. No respiratory distress.     Breath sounds: Normal breath sounds. No wheezing.  Abdominal:     General: Bowel sounds are normal. There is no distension.     Palpations: Abdomen is soft. There is no mass.  Musculoskeletal:     Right lower leg: No edema.     Left lower leg: No edema.      ASSESSMENT/PLAN:   Asthma Stable Albuterol refilled  Major depressive disorder Self d/ced Sertraline She declined Pscho-therapy as it had not been useful in the past. She is willing to trial Lexapro F/U with PCP in 4 weeks for dose adjustment She agreed with the plan   UTI symptoms: I called after visit to discuss UA result and plan with her UA with trace Leukocytes Treat with Keflex x 5 days F/U if no  improvement for urine culture She agreed with the plan  Janit Pagan, MD [facility]

## 2023-04-05 NOTE — Assessment & Plan Note (Signed)
Self d/ced Sertraline She declined Pscho-therapy as it had not been useful in the past. She is willing to trial Lexapro F/U with PCP in 4 weeks for dose adjustment She agreed with the plan

## 2023-04-05 NOTE — Patient Instructions (Signed)
Escitalopram Solution What is this medication? ESCITALOPRAM (es sye TAL oh pram) treats depression and anxiety. It increases the amount of serotonin in the brain, a hormone that helps regulate mood. It belongs to a group of medications called SSRIs. This medicine may be used for other purposes; ask your health care provider or pharmacist if you have questions. COMMON BRAND NAME(S): Lexapro What should I tell my care team before I take this medication? They need to know if you have any of these conditions: Bipolar disorder or a family history of bipolar disorder Diabetes Glaucoma Heart disease Kidney disease Liver disease Receiving electroconvulsive therapy Seizures Suicidal thoughts, plans, or attempt by you or a family member An unusual or allergic reaction to escitalopram, other medications, foods, dyes, or preservatives Pregnant or trying to become pregnant Breastfeeding How should I use this medication? Take this medication by mouth. Follow the directions on the prescription label. Use a specially marked spoon or container to measure your medication. Ask your pharmacist if you do not have one. Household spoons are not accurate. This medication can be taken with or without food. Take your medication at regular intervals. Do not take it more often than directed. Do not stop taking this medication suddenly except upon the advice of your care team. Stopping this medication too quickly may cause serious side effects or your condition may worsen. A special MedGuide will be given to you by the pharmacist with each prescription and refill. Be sure to read this information carefully each time. Talk to your care team about the use of this medication in children. Special care may be needed. Overdosage: If you think you have taken too much of this medicine contact a poison control center or emergency room at once. NOTE: This medicine is only for you. Do not share this medicine with others. What if I  miss a dose? If you miss a dose, take it as soon as you can. If it is almost time for your next dose, take only that dose. Do not take double or extra doses. What may interact with this medication? Do not take this medication with any of the following: Certain medications for fungal infections, such as fluconazole, itraconazole, ketoconazole, posaconazole, voriconazole Cisapride Citalopram Dronedarone Linezolid MAOIs, such as Carbex, Eldepryl, Marplan, Nardil, and Parnate Methylene blue (injected into a vein) Pimozide Thioridazine This medication may also interact with the following: Alcohol Amphetamines Aspirin and aspirin-like medications Carbamazepine Certain medications for mental health conditions Certain medications for migraine headache, such as almotriptan, eletriptan, frovatriptan, naratriptan, rizatriptan, sumatriptan, zolmitriptan Certain medications for sleep Certain medications that treat or prevent blood clots, such as warfarin, enoxaparin, and dalteparin Cimetidine Diuretics Dofetilide Fentanyl Furazolidone Isoniazid Lithium Metoprolol NSAIDs, medications for pain and inflammation, such as ibuprofen or naproxen Other medications that cause heart rhythm changes Procarbazine Rasagiline Supplements, such as St. John's wort, kava kava, valerian Tramadol Tryptophan Ziprasidone This list may not describe all possible interactions. Give your health care provider a list of all the medicines, herbs, non-prescription drugs, or dietary supplements you use. Also tell them if you smoke, drink alcohol, or use illegal drugs. Some items may interact with your medicine. What should I watch for while using this medication? Tell your care team if your symptoms do not get better or if they get worse. Visit your care team for regular checks on your progress. Because it may take several weeks to see the full effects of this medication, it is important to continue your treatment as  prescribed by  your care team. Watch for new or worsening thoughts of suicide or depression. This includes sudden changes in mood, behaviors, or thoughts. These changes can happen at any time but are more common in the beginning of treatment or after a change in dose. Call your care team right away if you experience these thoughts or worsening depression. This medication may cause mood and behavior changes, such as anxiety, nervousness, irritability, hostility, restlessness, excitability, hyperactivity, or trouble sleeping. These changes can happen at any time but are more common in the beginning of treatment or after a change in dose. Call your care team right away if you notice any of these symptoms. This medication may affect your coordination, reaction time, or judgment. Do not drive or operate machinery until you know how this medication affects you. Sit up or stand slowly to reduce the risk of dizzy or fainting spells. Drinking alcohol with this medication can increase the risk of these side effects. Your mouth may get dry. Chewing sugarless gum or sucking hard candy, and drinking plenty of water may help. Contact your care team if the problem does not go away or is severe. What side effects may I notice from receiving this medication? Side effects that you should report to your care team as soon as possible: Allergic reactions--skin rash, itching, hives, swelling of the face, lips, tongue, or throat Bleeding--bloody or black, tar-like stools, red or dark brown urine, vomiting blood or brown material that looks like coffee grounds, small, red or purple spots on skin, unusual bleeding or bruising Heart rhythm changes--fast or irregular heartbeat, dizziness, feeling faint or lightheaded, chest pain, trouble breathing Low sodium level--muscle weakness, fatigue, dizziness, headache, confusion Serotonin syndrome--irritability, confusion, fast or irregular heartbeat, muscle stiffness, twitching muscles,  sweating, high fever, seizure, chills, vomiting, diarrhea Sudden eye pain or change in vision such as blurry vision, seeing halos around lights, vision loss Thoughts of suicide or self-harm, worsening mood, feelings of depression Side effects that usually do not require medical attention (report to your care team if they continue or are bothersome): Change in sex drive or performance Diarrhea Excessive sweating Nausea Tremors or shaking Upset stomach This list may not describe all possible side effects. Call your doctor for medical advice about side effects. You may report side effects to FDA at 1-800-FDA-1088. Where should I keep my medication? Keep out of reach of children and pets. Store at room temperature between 15 and 30 degrees C (59 and 86 degrees F). Throw away any unused medication after the expiration date. NOTE: This sheet is a summary. It may not cover all possible information. If you have questions about this medicine, talk to your doctor, pharmacist, or health care provider.  2024 Elsevier/Gold Standard (2022-05-08 00:00:00)

## 2023-04-13 ENCOUNTER — Ambulatory Visit: Payer: Medicaid Other | Admitting: Student

## 2023-04-13 NOTE — Progress Notes (Deleted)
    SUBJECTIVE:   CHIEF COMPLAINT / HPI:   ***  PERTINENT  PMH / PSH: ***  OBJECTIVE:   LMP 04/01/2023   ***  ASSESSMENT/PLAN:   No problem-specific Assessment & Plan notes found for this encounter.     Levin Erp, MD [facility]

## 2023-04-30 ENCOUNTER — Ambulatory Visit: Payer: Self-pay | Admitting: Student
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
## 2023-05-17 ENCOUNTER — Ambulatory Visit: Payer: Medicaid Other | Admitting: Student

## 2023-05-17 NOTE — Progress Notes (Deleted)
    SUBJECTIVE:   CHIEF COMPLAINT / HPI:   Concern for PCOS  Patient is worried about the possibility of PCOS Reports that this is a concern for her because *** Signs of hyperandrogenism: acne- ***, hair thinning- ***, facial hair-*** LMP-*** Normal cycle: *** Missed cycles: *** Change in cycle: *** Sexual activity: ***  PERTINENT  PMH / PSH:  Asthma  Irregular period hx  MDD - recently started on Lexapro   OBJECTIVE:   There were no vitals taken for this visit.  General: Alert and oriented in no apparent distress Heart: Regular rate and rhythm with no murmurs appreciated Lungs: CTA bilaterally, no wheezing Abdomen: Bowel sounds present, no abdominal pain Skin: Warm and dry Extremities: No lower extremity edema   ASSESSMENT/PLAN:   Metformin, COCs, exercise and lifestyle modification.   Alfredo Martinez, MD [facility]

## 2023-05-18 ENCOUNTER — Ambulatory Visit: Payer: Medicaid Other | Admitting: Family Medicine

## 2023-05-18 ENCOUNTER — Other Ambulatory Visit (HOSPITAL_COMMUNITY)
Admission: RE | Admit: 2023-05-18 | Discharge: 2023-05-18 | Disposition: A | Discharge status: Home / Self Care | Payer: Medicaid Other | Source: Ambulatory Visit | Attending: Family Medicine | Admitting: Family Medicine

## 2023-05-18 VITALS — BP 109/79 | HR 73 | Ht 62.0 in | Wt 120.0 lb

## 2023-05-18 DIAGNOSIS — Z113 Encounter for screening for infections with a predominantly sexual mode of transmission: Secondary | ICD-10-CM | POA: Insufficient documentation

## 2023-05-18 NOTE — Patient Instructions (Signed)
I will give you a call if any of your results from today are abnormal.  Everything looks good, I will send you message on MyChart.

## 2023-05-18 NOTE — Progress Notes (Signed)
    SUBJECTIVE:   CHIEF COMPLAINT / HPI:   Reports vaginal discharge  On and off for about 6 months, noticed this episode about 2wks ago Discharge is clear No itching or pain. No abd pain, fevers, dysuria Not sexually active, has not been for over 7-8 months  LMP sept 14th Periods are regular Not on any birth control   PERTINENT  PMH / PSH: UTI in aug 2024  OBJECTIVE:   BP 109/79   Pulse 73   Ht 5\' 2"  (1.575 m)   Wt 120 lb (54.4 kg)   LMP 04/28/2023   SpO2 100%   BMI 21.95 kg/m    General: NAD, pleasant, able to participate in exam Respiratory: No respiratory distress Skin: warm and dry, no rashes noted Psych: Normal affect and mood  Pelvic exam: VULVA: normal appearing vulva with no masses, tenderness or lesions, VAGINA: vaginal discharge - white and scant, CERVIX: normal appearing cervix without discharge or lesions, exam chaperoned by Clabe Seal CMA.  ASSESSMENT/PLAN:   Assessment & Plan Screen for STD (sexually transmitted disease) Pelvic exam with scant white vaginal discharge, otherwise unremarkable. Testing for GC, trich, BV, yeast, HIV, RPR done today. F/u results and treat as appropriate   Vonna Drafts, MD [facility]

## 2023-05-19 LAB — HIV ANTIBODY (ROUTINE TESTING W REFLEX): HIV Screen 4th Generation wRfx: NONREACTIVE

## 2023-05-19 LAB — RPR: RPR Ser Ql: NONREACTIVE

## 2023-05-20 ENCOUNTER — Encounter: Payer: Self-pay | Admitting: Family Medicine

## 2023-05-21 ENCOUNTER — Telehealth: Payer: Self-pay | Admitting: Family Medicine

## 2023-05-21 DIAGNOSIS — B9689 Other specified bacterial agents as the cause of diseases classified elsewhere: Secondary | ICD-10-CM

## 2023-05-21 LAB — CERVICOVAGINAL ANCILLARY ONLY
Bacterial Vaginitis (gardnerella): POSITIVE — AB
Candida Glabrata: NEGATIVE
Candida Vaginitis: NEGATIVE
Chlamydia: NEGATIVE
Comment: NEGATIVE
Comment: NEGATIVE
Comment: NEGATIVE
Comment: NEGATIVE
Comment: NEGATIVE
Comment: NORMAL
Neisseria Gonorrhea: NEGATIVE
Trichomonas: NEGATIVE

## 2023-05-21 MED ORDER — METRONIDAZOLE 0.75 % VA GEL
1.0000 | Freq: Every day | VAGINAL | 0 refills | Status: AC
Start: 2023-05-21 — End: ?

## 2023-05-21 NOTE — Telephone Encounter (Signed)
Called pt to discuss + BV. Requests metronidazole gel rather than pill. Sent metrogel at bedtime x5d to pharmacy. Discussed that if she has a yeast infection after completion of the course, she can let us know and we can treat as appropriate.  Vonna Drafts, MD

## 2023-10-09 ENCOUNTER — Telehealth: Payer: Self-pay

## 2023-10-09 MED ORDER — NEFFY 2 MG/0.1ML NA SOLN: 1.0000 | NASAL | 2 refills | Status: AC

## 2023-10-09 NOTE — Telephone Encounter (Signed)
 Patient's mother, Barbette Or, called in - DOB/NEED updated DPR verified - requested Neffy instead of epi pen for shellfish allergy.  Neffy prescription sent to [facility].

## 2023-10-10 DIAGNOSIS — H5213 Myopia, bilateral: Secondary | ICD-10-CM | POA: Diagnosis not present

## 2023-11-21 DIAGNOSIS — H52223 Regular astigmatism, bilateral: Secondary | ICD-10-CM | POA: Diagnosis not present

## 2024-01-14 ENCOUNTER — Ambulatory Visit

## 2024-01-22 ENCOUNTER — Encounter: Payer: Self-pay | Admitting: *Deleted

## 2024-01-24 ENCOUNTER — Ambulatory Visit: Admitting: Family Medicine

## 2024-01-24 ENCOUNTER — Other Ambulatory Visit (HOSPITAL_COMMUNITY)
Admission: RE | Admit: 2024-01-24 | Discharge: 2024-01-24 | Disposition: A | Discharge status: Home / Self Care | Source: Ambulatory Visit | Attending: Family Medicine | Admitting: Family Medicine

## 2024-01-24 VITALS — BP 112/62 | HR 88 | Ht 62.0 in | Wt 130.2 lb

## 2024-01-24 DIAGNOSIS — N898 Other specified noninflammatory disorders of vagina: Secondary | ICD-10-CM | POA: Insufficient documentation

## 2024-01-24 DIAGNOSIS — Z113 Encounter for screening for infections with a predominantly sexual mode of transmission: Secondary | ICD-10-CM

## 2024-01-24 LAB — POCT WET PREP (WET MOUNT)
Clue Cells Wet Prep Whiff POC: NEGATIVE
Trichomonas Wet Prep HPF POC: ABSENT

## 2024-01-24 NOTE — Patient Instructions (Addendum)
 Dear Caitlyn Campos  Today we discussed the following concerns and plans:  Vaginal itching - we collected samples today. I will let you know the results when they are back. If you need any medication I will send that in to your pharmacy.  If you have any concerns, please call the clinic or schedule an appointment.  It was a pleasure to take care of you today. Be well!  Omar Bibber, DO [facility], PGY-1

## 2024-01-24 NOTE — Progress Notes (Signed)
    SUBJECTIVE:   CHIEF COMPLAINT / HPI:   Vaginal itching Itching, odor. Was treated with flagyl  from GYN office earlier this year but feels like symptoms did not fully resolve. FDLMP: approx 5/10, typically has 5 week cycle Birth control: none Sexually active: not currently, most recently about 6 weeks ago. Barrier method: does not use  Also desires STI testing today.  PERTINENT  PMH / PSH: Reviewed.  OBJECTIVE:   BP 112/62   Pulse 88   Ht 5' 2 (1.575 m)   Wt 130 lb 3.2 oz (59.1 kg)   SpO2 99%   BMI 23.81 kg/m    General: well-appearing, no acute distress. Pulm: No increased work of breathing. Abdominal: bowel sounds present, soft, non-tender, non-distended. Extremities: Moves all extremities equally. Psych:  Cognition and judgment appear intact. Alert, communicative.  GU Exam:  External exam: Normal-appearing female external genitalia.   Vaginal exam notable for normal vaginal ruggae.  Cervix without discharge or obvious lesion. Chaperoned exam, CMA Desiree.    ASSESSMENT/PLAN:   Assessment & Plan Vaginal itching Swabbed today. - will let pt know results and send in appropriate treatment as indicated. Routine screening for STI (sexually transmitted infection) - HIV, RPR today     Omar Bibber, DO [facility]

## 2024-01-24 NOTE — Assessment & Plan Note (Signed)
HIV, RPR today

## 2024-01-25 ENCOUNTER — Ambulatory Visit: Payer: Self-pay | Admitting: Family Medicine

## 2024-01-25 LAB — HIV ANTIBODY (ROUTINE TESTING W REFLEX): HIV Screen 4th Generation wRfx: NONREACTIVE

## 2024-01-25 LAB — RPR: RPR Ser Ql: NONREACTIVE

## 2024-01-28 ENCOUNTER — Other Ambulatory Visit: Payer: Self-pay | Admitting: Family Medicine

## 2024-01-28 ENCOUNTER — Encounter: Payer: Self-pay | Admitting: Family Medicine

## 2024-01-28 LAB — CERVICOVAGINAL ANCILLARY ONLY
Bacterial Vaginitis (gardnerella): POSITIVE — AB
Candida Glabrata: NEGATIVE
Candida Vaginitis: NEGATIVE
Chlamydia: NEGATIVE
Comment: NEGATIVE
Comment: NEGATIVE
Comment: NEGATIVE
Comment: NEGATIVE
Comment: NEGATIVE
Comment: NORMAL
Neisseria Gonorrhea: NEGATIVE
Trichomonas: NEGATIVE

## 2024-01-28 MED ORDER — METRONIDAZOLE 500 MG PO TABS: 500.0000 mg | ORAL_TABLET | Freq: Two times a day (BID) | ORAL | 0 refills | Status: AC

## 2024-02-13 ENCOUNTER — Ambulatory Visit

## 2024-07-08 ENCOUNTER — Ambulatory Visit
